# Patient Record
Sex: Female | Born: 1975 | Race: Black or African American | Hispanic: No | Marital: Married | Smoking: Former smoker
Health system: Southern US, Community
[De-identification: ages and names within clinical notes are randomized; demographics above are authoritative.]

## PROBLEM LIST (undated history)

## (undated) DIAGNOSIS — T8859XA Other complications of anesthesia, initial encounter: Secondary | ICD-10-CM

## (undated) DIAGNOSIS — K219 Gastro-esophageal reflux disease without esophagitis: Secondary | ICD-10-CM

## (undated) HISTORY — PX: TUBAL LIGATION: SHX77

## (undated) HISTORY — DX: Gastro-esophageal reflux disease without esophagitis: K21.9

---

## 1998-05-10 ENCOUNTER — Ambulatory Visit (HOSPITAL_COMMUNITY): Admission: RE | Admit: 1998-05-10 | Discharge: 1998-05-10 | Payer: Self-pay | Admitting: *Deleted

## 1998-10-26 ENCOUNTER — Encounter (HOSPITAL_COMMUNITY): Admission: AD | Admit: 1998-10-26 | Discharge: 1998-11-05 | Payer: Self-pay | Admitting: Obstetrics

## 1998-11-02 ENCOUNTER — Inpatient Hospital Stay (HOSPITAL_COMMUNITY): Admission: AD | Admit: 1998-11-02 | Discharge: 1998-11-05 | Payer: Self-pay | Admitting: *Deleted

## 1999-07-06 ENCOUNTER — Emergency Department (HOSPITAL_COMMUNITY): Admission: EM | Admit: 1999-07-06 | Discharge: 1999-07-06 | Payer: Self-pay | Admitting: Emergency Medicine

## 2000-06-06 ENCOUNTER — Emergency Department (HOSPITAL_COMMUNITY): Admission: EM | Admit: 2000-06-06 | Discharge: 2000-06-06 | Payer: Self-pay | Admitting: Emergency Medicine

## 2000-08-18 ENCOUNTER — Encounter: Admission: RE | Admit: 2000-08-18 | Discharge: 2000-08-18 | Payer: Self-pay | Admitting: Obstetrics & Gynecology

## 2000-09-04 ENCOUNTER — Encounter: Admission: RE | Admit: 2000-09-04 | Discharge: 2000-09-04 | Payer: Self-pay | Admitting: Obstetrics & Gynecology

## 2000-10-17 ENCOUNTER — Emergency Department (HOSPITAL_COMMUNITY): Admission: EM | Admit: 2000-10-17 | Discharge: 2000-10-17 | Payer: Self-pay | Admitting: Emergency Medicine

## 2001-02-09 ENCOUNTER — Encounter: Admission: RE | Admit: 2001-02-09 | Discharge: 2001-02-09 | Payer: Self-pay | Admitting: Obstetrics & Gynecology

## 2001-02-09 ENCOUNTER — Other Ambulatory Visit: Admission: RE | Admit: 2001-02-09 | Discharge: 2001-02-09 | Payer: Self-pay | Admitting: Obstetrics & Gynecology

## 2001-04-02 ENCOUNTER — Encounter: Admission: RE | Admit: 2001-04-02 | Discharge: 2001-04-02 | Payer: Self-pay | Admitting: Obstetrics & Gynecology

## 2001-10-08 ENCOUNTER — Encounter: Payer: Self-pay | Admitting: Emergency Medicine

## 2001-10-08 ENCOUNTER — Emergency Department (HOSPITAL_COMMUNITY): Admission: EM | Admit: 2001-10-08 | Discharge: 2001-10-08 | Payer: Self-pay | Admitting: Emergency Medicine

## 2002-05-04 ENCOUNTER — Emergency Department (HOSPITAL_COMMUNITY): Admission: EM | Admit: 2002-05-04 | Discharge: 2002-05-04 | Payer: Self-pay | Admitting: Emergency Medicine

## 2002-08-16 ENCOUNTER — Encounter: Admission: RE | Admit: 2002-08-16 | Discharge: 2002-08-16 | Payer: Self-pay | Admitting: *Deleted

## 2002-08-16 ENCOUNTER — Other Ambulatory Visit: Admission: RE | Admit: 2002-08-16 | Discharge: 2002-08-16 | Payer: Self-pay | Admitting: *Deleted

## 2002-11-14 ENCOUNTER — Encounter: Admission: RE | Admit: 2002-11-14 | Discharge: 2002-11-14 | Payer: Self-pay | Admitting: Internal Medicine

## 2003-07-10 ENCOUNTER — Encounter: Admission: RE | Admit: 2003-07-10 | Discharge: 2003-07-10 | Payer: Self-pay | Admitting: Internal Medicine

## 2004-05-22 ENCOUNTER — Emergency Department (HOSPITAL_COMMUNITY): Admission: EM | Admit: 2004-05-22 | Discharge: 2004-05-22 | Payer: Self-pay | Admitting: *Deleted

## 2004-05-23 ENCOUNTER — Emergency Department (HOSPITAL_COMMUNITY): Admission: EM | Admit: 2004-05-23 | Discharge: 2004-05-23 | Payer: Self-pay | Admitting: Emergency Medicine

## 2004-09-27 ENCOUNTER — Emergency Department (HOSPITAL_COMMUNITY): Admission: EM | Admit: 2004-09-27 | Discharge: 2004-09-27 | Payer: Self-pay | Admitting: Emergency Medicine

## 2004-12-21 ENCOUNTER — Emergency Department (HOSPITAL_COMMUNITY): Admission: EM | Admit: 2004-12-21 | Discharge: 2004-12-21 | Payer: Self-pay | Admitting: Emergency Medicine

## 2005-11-12 ENCOUNTER — Emergency Department (HOSPITAL_COMMUNITY): Admission: EM | Admit: 2005-11-12 | Discharge: 2005-11-12 | Payer: Self-pay | Admitting: Emergency Medicine

## 2006-07-08 ENCOUNTER — Ambulatory Visit: Payer: Self-pay | Admitting: Nurse Practitioner

## 2006-09-04 ENCOUNTER — Inpatient Hospital Stay (HOSPITAL_COMMUNITY): Admission: AD | Admit: 2006-09-04 | Discharge: 2006-09-04 | Payer: Self-pay | Admitting: Gynecology

## 2006-10-09 ENCOUNTER — Ambulatory Visit: Payer: Self-pay | Admitting: Nurse Practitioner

## 2006-12-11 ENCOUNTER — Emergency Department (HOSPITAL_COMMUNITY): Admission: EM | Admit: 2006-12-11 | Discharge: 2006-12-11 | Payer: Self-pay | Admitting: Emergency Medicine

## 2008-02-22 ENCOUNTER — Ambulatory Visit: Payer: Self-pay | Admitting: Obstetrics & Gynecology

## 2008-02-22 ENCOUNTER — Inpatient Hospital Stay (HOSPITAL_COMMUNITY): Admission: AD | Admit: 2008-02-22 | Discharge: 2008-02-23 | Payer: Self-pay | Admitting: Obstetrics & Gynecology

## 2008-08-23 IMAGING — CT CT PELVIS W/ CM
2 of 4 series · 12 of 32 positions shown, 17 images · IV contrast (omnipaque)
Comparison: none

CLINICAL DATA: Abdominal pain. 
 ABDOMEN CT WITH CONTRAST ? 02/23/08:
TECHNIQUE: Multidetector CT imaging of the abdomen was performed following the standard protocol during bolus administration of intravenous contrast. 
 Contrast: 100 ml Omnipaque 300 IV and oral contrast.
 Comparing pelvic ultrasound from 02/22/08.
TECHNIQUE: Multidetector CT imaging of the pelvis was performed following the standard protocol during bolus administration of intravenous contrast.

[Series 2: abd pelvis · axial · 0.70mm/px · z∈[-386,-146]mm · 4 of 81 slices shown, 9 images]
[im 17/81  soft-tissue]
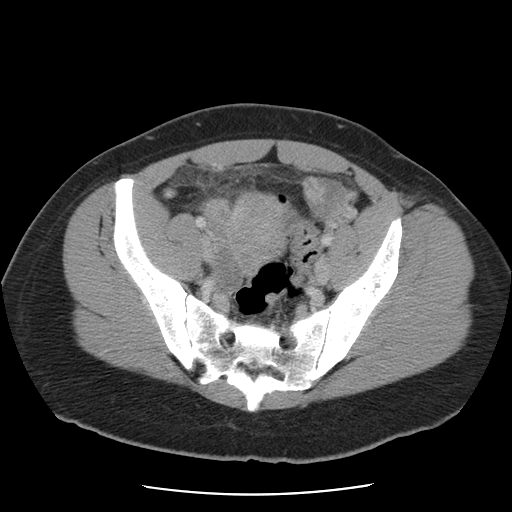
[im 17/81  lung]
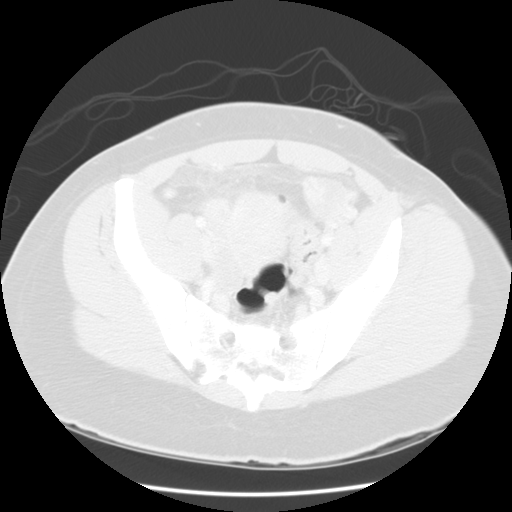
[im 17/81  bone]
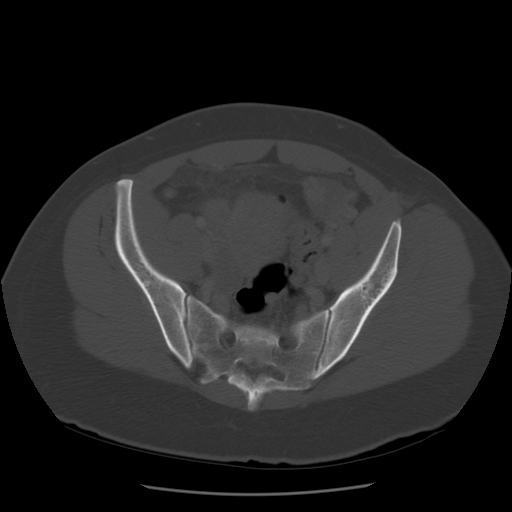
[im 33/81  soft-tissue]
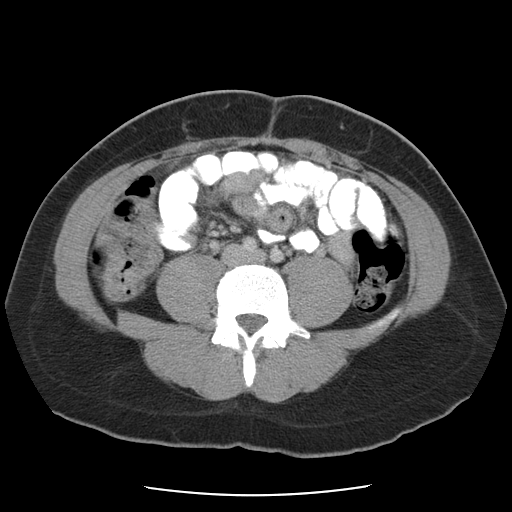
[im 33/81  lung]
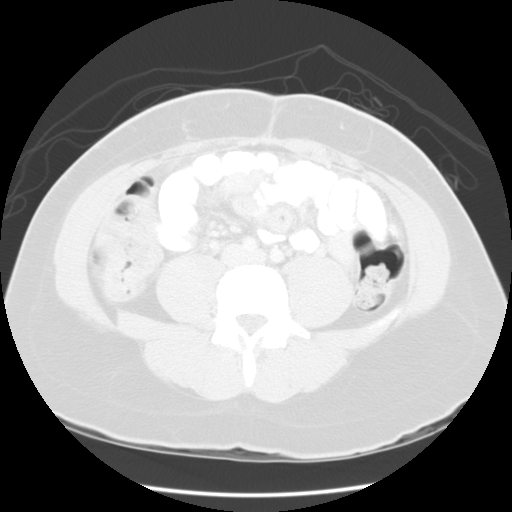
[im 49/81  soft-tissue]
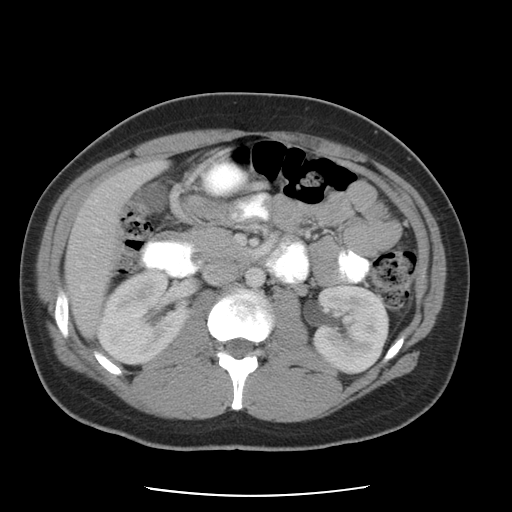
[im 49/81  lung]
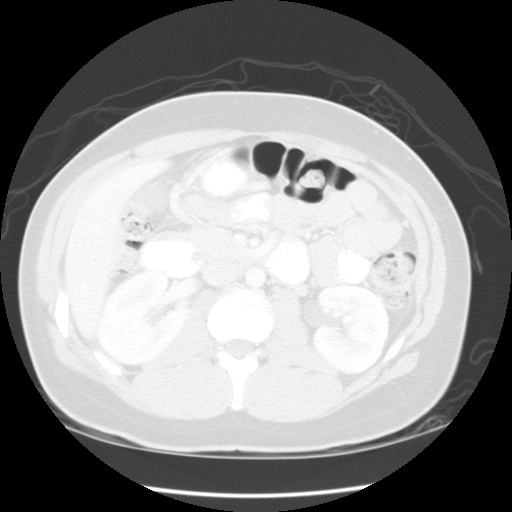
[im 65/81  soft-tissue]
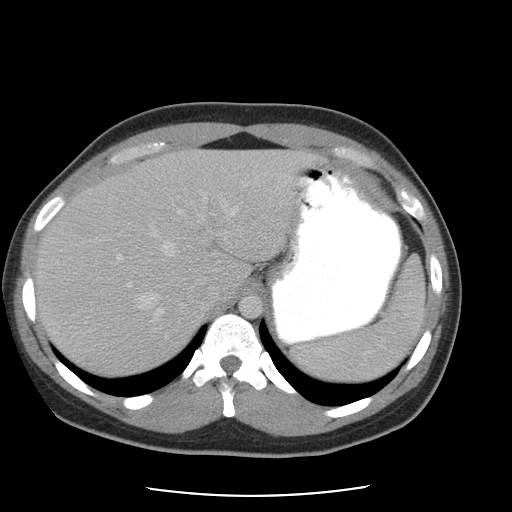
[im 65/81  lung]
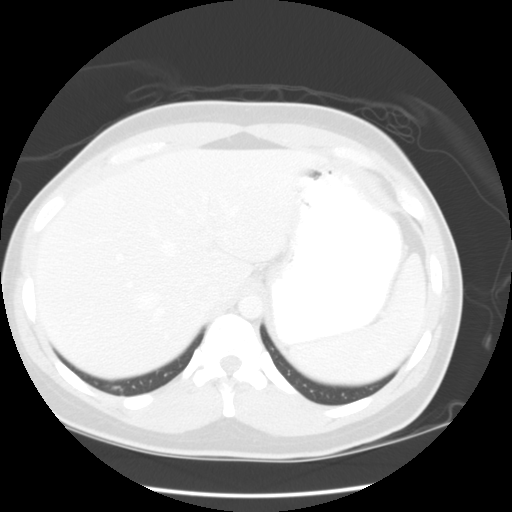

[Series 401: reformatted · sagittal · 0.88mm/px · 8 of 175 slices shown]
[im 15/175  soft-tissue]
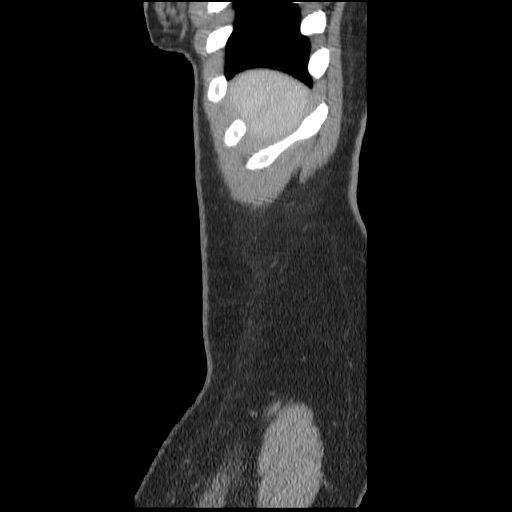
[im 44/175  soft-tissue]
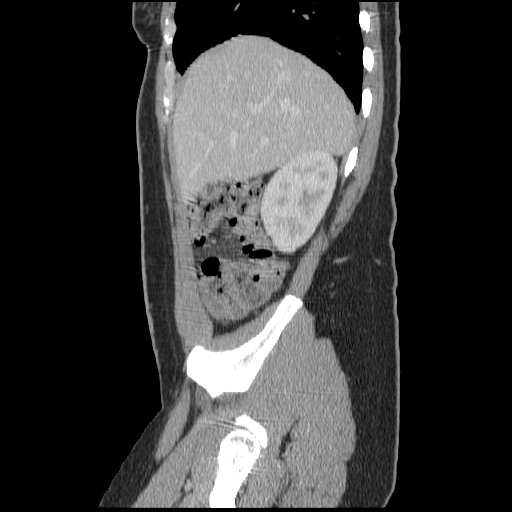
[im 59/175  soft-tissue]
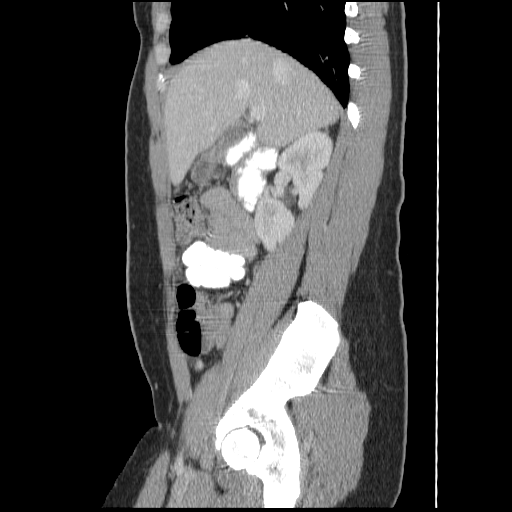
[im 73/175  soft-tissue]
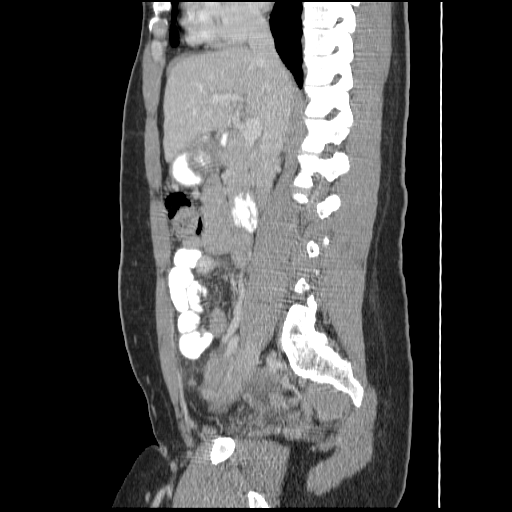
[im 102/175  soft-tissue]
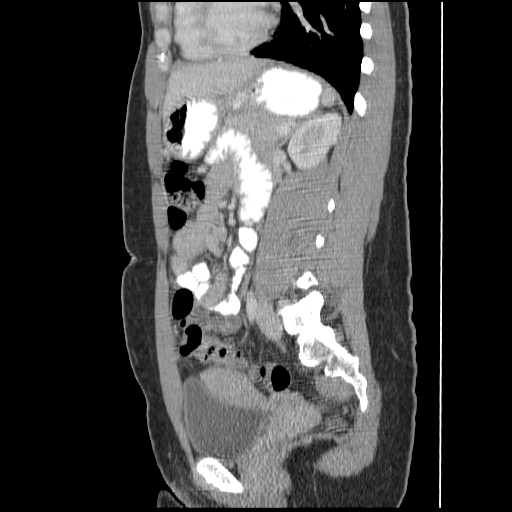
[im 117/175  soft-tissue]
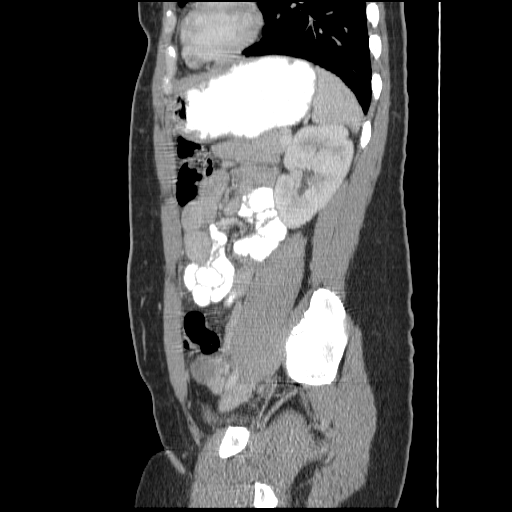
[im 131/175  soft-tissue]
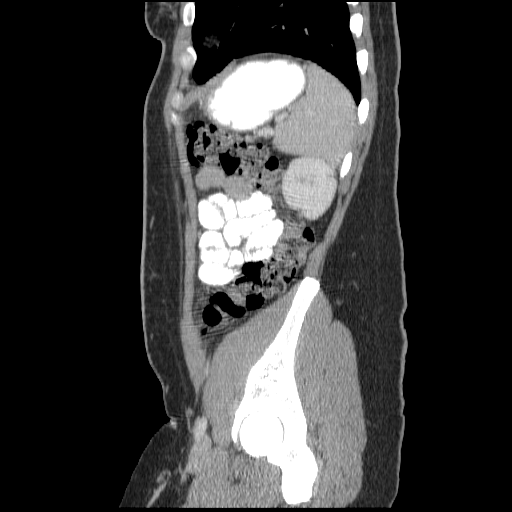
[im 160/175  soft-tissue]
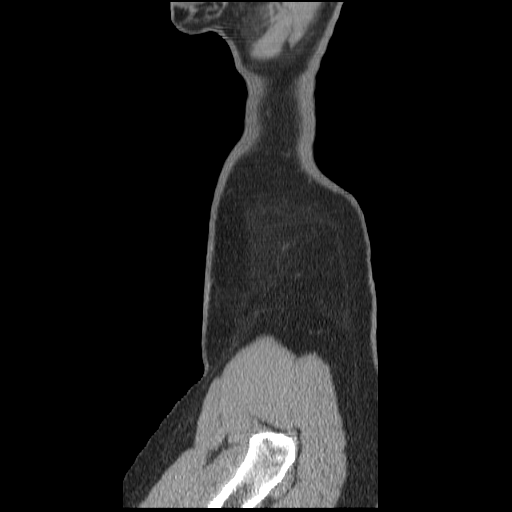

[12 of 32 positions shown; findings below may reference images not displayed]

FINDINGS: The liver, spleen, pancreas, adrenal glands, and gallbladder appear normal.  The kidneys appear normal.  
 There is mild stranding in the mesentery of the ascending colon, potentially reflecting mild localized inflammation.  The orally administered contrast medium is in the duodenum and jejunum but has not made its way through to the ileum.
IMPRESSION: Mild stranding in the ascending colon mesentery, possibly indicative of inflammation in this vicinity.  No overt colonic wall thickening is noted.
 PELVIS CT WITH CONTRAST ? 02/23/08:
FINDINGS: The left ovary measures 3.2 x 2.9 cm on image #65 of series 2, and has a dense component likely representing a follicle.  The appendix appears normal although there is some mesenteric stranding adjacent to the cecum.  A low-density structure along the right posterolateral margin of the uterus measuring 3.8 x 2.1 cm likely represents the right ovary, although there is also some fullness along the broad ligament.  There is potentially some wall thickening in the terminal ileum which may represent localized inflammation.
 The urinary bladder appears normal.
IMPRESSION: 1.  There is abnormal stranding in the mesentery in the right abdomen, possibly due to colitis/ileitis.  Some of this is adjacent to the appendix but the appendix is not inflamed.  
 2.  There is fullness along the broad ligament and right ovary.  Given the negative pregnancy test, ectopic pregnancy is not a consideration.  This may simply be incidental fullness.  Some of this may represent prominent parametrial vasculature.

## 2011-09-08 LAB — COMPREHENSIVE METABOLIC PANEL
ALT: 11
AST: 13
Albumin: 3.8
Alkaline Phosphatase: 56
BUN: 6
CO2: 27
Calcium: 9.2
Chloride: 103
Creatinine, Ser: 0.7
GFR calc Af Amer: >60
GFR calc non Af Amer: >60
Glucose, Bld: 94
Potassium: 4.1
Sodium: 139
Total Bilirubin: 0.8
Total Protein: 7.1

## 2011-09-08 LAB — URINALYSIS, ROUTINE W REFLEX MICROSCOPIC
Bilirubin Urine: NEGATIVE
Glucose, UA: NEGATIVE
Hgb urine dipstick: NEGATIVE
Ketones, ur: NEGATIVE
Nitrite: NEGATIVE
Protein, ur: NEGATIVE
Specific Gravity, Urine: 1.015
Urobilinogen, UA: 0.2
pH: 5.5

## 2011-09-08 LAB — CBC
HCT: 32 — ABNORMAL LOW
Hemoglobin: 11 — ABNORMAL LOW
MCHC: 34.3
MCV: 89.7
Platelets: 256
RBC: 3.57 — ABNORMAL LOW
RDW: 13.4
WBC: 13.1 — ABNORMAL HIGH

## 2011-09-08 LAB — WET PREP, GENITAL
Clue Cells Wet Prep HPF POC: NONE SEEN
Trich, Wet Prep: NONE SEEN
Yeast Wet Prep HPF POC: NONE SEEN

## 2011-09-08 LAB — DIFFERENTIAL
Basophils Absolute: 0
Basophils Relative: 0
Eosinophils Absolute: 0.3
Eosinophils Relative: 2
Lymphocytes Relative: 20
Lymphs Abs: 2.7
Monocytes Absolute: 0.6
Monocytes Relative: 4
Neutro Abs: 9.5 — ABNORMAL HIGH
Neutrophils Relative %: 73

## 2011-09-08 LAB — GC/CHLAMYDIA PROBE AMP, GENITAL
Chlamydia, DNA Probe: POSITIVE — AB
GC Probe Amp, Genital: NEGATIVE

## 2011-09-08 LAB — POCT PREGNANCY, URINE
Operator id: 114931
Preg Test, Ur: NEGATIVE

## 2013-04-25 ENCOUNTER — Other Ambulatory Visit (HOSPITAL_COMMUNITY)
Admission: RE | Admit: 2013-04-25 | Discharge: 2013-04-25 | Disposition: A | Discharge status: Home / Self Care | Payer: Medicaid Other | Source: Ambulatory Visit | Attending: Internal Medicine | Admitting: Internal Medicine

## 2013-04-25 ENCOUNTER — Encounter: Payer: Self-pay | Admitting: Internal Medicine

## 2013-04-25 ENCOUNTER — Ambulatory Visit (INDEPENDENT_AMBULATORY_CARE_PROVIDER_SITE_OTHER): Payer: Medicaid Other | Admitting: Internal Medicine

## 2013-04-25 VITALS — BP 116/67 | HR 61 | Temp 98.1°F | Ht 64.0 in | Wt 187.1 lb

## 2013-04-25 DIAGNOSIS — N760 Acute vaginitis: Secondary | ICD-10-CM

## 2013-04-25 DIAGNOSIS — N898 Other specified noninflammatory disorders of vagina: Secondary | ICD-10-CM

## 2013-04-25 DIAGNOSIS — Z113 Encounter for screening for infections with a predominantly sexual mode of transmission: Secondary | ICD-10-CM | POA: Insufficient documentation

## 2013-04-25 DIAGNOSIS — E669 Obesity, unspecified: Secondary | ICD-10-CM

## 2013-04-25 DIAGNOSIS — K219 Gastro-esophageal reflux disease without esophagitis: Secondary | ICD-10-CM

## 2013-04-25 DIAGNOSIS — Z01419 Encounter for gynecological examination (general) (routine) without abnormal findings: Secondary | ICD-10-CM | POA: Insufficient documentation

## 2013-04-25 DIAGNOSIS — B9689 Other specified bacterial agents as the cause of diseases classified elsewhere: Secondary | ICD-10-CM

## 2013-04-25 DIAGNOSIS — N949 Unspecified condition associated with female genital organs and menstrual cycle: Secondary | ICD-10-CM

## 2013-04-25 DIAGNOSIS — Z87898 Personal history of other specified conditions: Secondary | ICD-10-CM

## 2013-04-25 DIAGNOSIS — Z1151 Encounter for screening for human papillomavirus (HPV): Secondary | ICD-10-CM | POA: Insufficient documentation

## 2013-04-25 DIAGNOSIS — A499 Bacterial infection, unspecified: Secondary | ICD-10-CM

## 2013-04-25 DIAGNOSIS — B353 Tinea pedis: Secondary | ICD-10-CM

## 2013-04-25 MED ORDER — FLUCONAZOLE 150 MG PO TABS: 150.0000 mg | ORAL_TABLET | ORAL | Status: DC

## 2013-04-25 MED ORDER — OMEPRAZOLE 20 MG PO CPDR: 20.0000 mg | DELAYED_RELEASE_CAPSULE | Freq: Every day | ORAL | Status: DC

## 2013-04-25 NOTE — Progress Notes (Signed)
Subjective:    Patient ID: Nicole House, female    DOB: 1976-07-22, 37 y.o.   MRN: [id number]  HPI Comments: 37 y.o no significant PMH presents to establish care. Concerned that her feet are peeling itching, blistering.  She has tried Lotrimin, [employer] oint w/o relief.  She is concerned due to family history of diabetes that she may be a risk for diabetes.  She has sob at night associated with heartburn/reflux.  She takes she feels like food contents come back up and she has to get up to the bathroom and vomit and go to the shower and run water.  She is not aware of food triggers, Tums helps.  She c/o vaginal odor fishy.  She is sexually active with one partner with condom use.  She has a history of abnormal pap years ago she went to [facility] on [address] but repeat pap normal.  She has not had a pap in 5 years.  She has been without medical care for 5 years due to not having insurance.  She reports chronic h/a (frontal, crown).  Triggers include stress or washing her hair as she has dreadlocks and when they are wet they get heavy.  She drinks coffee and green tea but h/a were present prior to caffeine use.  She is supposed to wear glasses and odes not at times.  H/a are worse with light, noise.  She does not have a h/a today and normally takes Xtra strength Tylenol.    SH: 2 kids 14 and 17.  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 Denies etOH, cigarettes, other drugs.      Review of Systems  Gastrointestinal: Negative for abdominal pain and constipation.  Genitourinary: Negative for dysuria and vaginal discharge.  Neurological: Negative for headaches.       Objective:   Physical Exam  Nursing note and vitals reviewed. Constitutional: She is oriented to person, place, and time. Vital signs are normal. She appears well-developed and well-nourished. She is cooperative. No distress.  HENT:  Head: Normocephalic and atraumatic.  Mouth/Throat: Oropharynx is clear and moist and  mucous membranes are normal. Abnormal dentition. No oropharyngeal exudate.  Broken tooth back molar  Eyes: Conjunctivae are normal. Pupils are equal, round, and reactive to light. Right eye exhibits no discharge. Left eye exhibits no discharge. No scleral icterus.  Cardiovascular: Normal rate, regular rhythm, S1 normal, S2 normal and normal heart sounds.   No murmur heard. Pulmonary/Chest: Effort normal and breath sounds normal. No respiratory distress. She has no wheezes.  Abdominal: Soft. Bowel sounds are normal. There is no tenderness.  Genitourinary: Pelvic exam was performed with patient supine. Cervix exhibits discharge and friability. Cervix exhibits no motion tenderness. Right adnexum displays no mass, no tenderness and no fullness. Left adnexum displays no mass, no tenderness and no fullness. Vaginal discharge found.  Cervix was friable with pap smear swab.  No CMT.  Clear to white min. Discharge and fishy odor   Neurological: She is alert and oriented to person, place, and time. She has normal strength. Gait normal.  Neurologically intact  Skin: Skin is warm and dry. No rash noted. She is not diaphoretic.  Feel with peeling to soles. Toenails painted   Psychiatric: She has a normal mood and affect. Her speech is normal and behavior is normal. Judgment and thought content normal. Cognition and memory are normal.          Assessment & Plan:  F/u 4-6 weeks t. Pedis  and GERD

## 2013-04-25 NOTE — Assessment & Plan Note (Signed)
Repeat pap today

## 2013-04-25 NOTE — Assessment & Plan Note (Signed)
Wet prep, GC/C done today

## 2013-04-25 NOTE — Assessment & Plan Note (Signed)
Will treat with PPI low dose x 4-6 wks and see patient for f/u Prilosec 20 mg qd

## 2013-04-25 NOTE — Addendum Note (Signed)
Addended by: Bufford Spikes on: 04/25/2013 03:28 PM   Modules accepted: Orders

## 2013-04-25 NOTE — Patient Instructions (Addendum)
Please read the information below Start taking Prilosec 20 mg x 4-6 weeks  Start taking Diflucan 150mg  every week x 4 weeks  Return ni 4-6 weeks   Fluconazole tablets What is this medicine? FLUCONAZOLE (floo KON na zole) is an antifungal medicine. It is used to treat certain kinds of fungal or yeast infections. This medicine may be used for other purposes; ask your health care provider or pharmacist if you have questions. What should I tell my health care provider before I take this medicine? They need to know if you have any of these conditions: -electrolyte abnormalities -history of irregular heart beat -kidney disease -an unusual or allergic reaction to fluconazole, other azole antifungals, medicines, foods, dyes, or preservatives -pregnant or trying to get pregnant -breast-feeding How should I use this medicine? Take this medicine by mouth. Follow the directions on the prescription label. Do not take your medicine more often than directed. Talk to your pediatrician regarding the use of this medicine in children. Special care may be needed. This medicine has been used in children as young as 38 months of age. Overdosage: If you think you have taken too much of this medicine contact a poison control center or emergency room at once. NOTE: This medicine is only for you. Do not share this medicine with others. What if I miss a dose? If you miss a dose, take it as soon as you can. If it is almost time for your next dose, take only that dose. Do not take double or extra doses. What may interact with this medicine? Do not take this medicine with any of the following medications: -cisapride -pimozide -red yeast rice This medicine may also interact with the following medications: -birth control pills -cyclosporine -diuretics like hydrochlorothiazide -medicines for diabetes that are taken by mouth -medicines for high cholesterol like atorvastatin, lovastatin or  simvastatin -phenytoin -ramelteon -rifabutin -rifampin -some medicines for anxiety or sleep -tacrolimus -terfenadine -theophylline -warfarin This list may not describe all possible interactions. Give your health care provider a list of all the medicines, herbs, non-prescription drugs, or dietary supplements you use. Also tell them if you smoke, drink alcohol, or use illegal drugs. Some items may interact with your medicine. What should I watch for while using this medicine? Visit your doctor or health care professional for regular checkups. If you are taking this medicine for a long time you may need blood work. Tell your doctor if your symptoms do not improve. Some fungal infections need many weeks or months of treatment to cure. Alcohol can increase possible damage to your liver. Avoid alcoholic drinks. If you have a vaginal infection, do not have sex until you have finished your treatment. You can wear a sanitary napkin. Do not use tampons. Wear freshly washed cotton, not synthetic, panties. What side effects may I notice from receiving this medicine? Side effects that you should report to your doctor or health care professional as soon as possible: -allergic reactions like skin rash or itching, hives, swelling of the lips, mouth, tongue, or throat -dark urine -feeling dizzy or faint -irregular heartbeat or chest pain -redness, blistering, peeling or loosening of the skin, including inside the mouth -trouble breathing -unusual bruising or bleeding -vomiting -yellowing of the eyes or skin Side effects that usually do not require medical attention (report to your doctor or health care professional if they continue or are bothersome): -changes in how food tastes -diarrhea -headache -stomach upset or nausea This list may not describe all possible side effects.  Call your doctor for medical advice about side effects. You may report side effects to FDA at 1-800-FDA-1088. Where should I  keep my medicine? Keep out of the reach of children. Store at room temperature below 30 degrees C (86 degrees F). Throw away any medicine after the expiration date. NOTE: This sheet is a summary. It may not cover all possible information. If you have questions about this medicine, talk to your doctor, pharmacist, or health care provider.  2012, Elsevier/Gold Standard. (04/10/2008 4:59:25 PM) Omeprazole tablets (OTC) What is this medicine? OMEPRAZOLE (oh ME pray zol) prevents the production of acid in the stomach. It is used to treat the symptoms of heartburn. You can buy this medicine without a prescription. This product is not for long-term use, unless otherwise directed by your doctor or health care professional. This medicine may be used for other purposes; ask your health care provider or pharmacist if you have questions. What should I tell my health care provider before I take this medicine? They need to know if you have any of these conditions: -black or bloody stools -chest pain -difficulty swallowing -have had heartburn for over 3 months -have heartburn with dizziness, lightheadedness or sweating -liver disease -stomach pain -unexplained weight loss -vomiting with blood -wheezing -an unusual or allergic reaction to omeprazole, other medicines, foods, dyes, or preservatives -pregnant or trying to get pregnant -breast-feeding How should I use this medicine? Take this medicine by mouth. Follow the directions on the product label. If you are taking this medicine without a prescription, take one tablet every day. Do not use for longer than 14 days or repeat a course of treatment more often than every 4 months unless directed by a doctor or healthcare professional. Take your dose at regular intervals every 24 hours. Swallow the tablet whole with a drink of water. Do not crush, break or chew. This medicine works best if taken on an empty stomach 30 minutes before breakfast. If you are using  this medicine with the prescription of your doctor or healthcare professional, follow the directions you were given. Do not take your medicine more often than directed. Talk to your pediatrician regarding the use of this medicine in children. Special care may be needed. Overdosage: If you think you have taken too much of this medicine contact a poison control center or emergency room at once. NOTE: This medicine is only for you. Do not share this medicine with others. What if I miss a dose? If you miss a dose, take it as soon as you can. If it is almost time for your next dose, take only that dose. Do not take double or extra doses. What may interact with this medicine? Do not take this medicine with any of the following medications: -atazanavir -clopidogrel -nelfinavir This medicine may also interact with the following medications: -ampicillin -certain medicines for anxiety or sleep -certain medicines that treat or prevent blood clots like warfarin -cyclosporine -diazepam -digoxin -disulfiram -iron salts -phenytoin -prescription medicine for fungal or yeast infection like itraconazole, ketoconazole, voriconazole -saquinavir -tacrolimus This list may not describe all possible interactions. Give your health care provider a list of all the medicines, herbs, non-prescription drugs, or dietary supplements you use. Also tell them if you smoke, drink alcohol, or use illegal drugs. Some items may interact with your medicine. What should I watch for while using this medicine? It can take several days before your heartburn gets better. Check with your doctor or health care professional if your condition does  not start to get better, or if it gets worse. Do not treat diarrhea with over the counter products. Contact your doctor if you have diarrhea that lasts more than 2 days or if it is severe and watery. Do not treat yourself for heartburn with this medicine for more than 14 days in a row. You  should only use this medicine for a 2-week treatment period once every 4 months. If your symptoms return shortly after your therapy is complete, or within the 4 month time frame, call your doctor or health care professional. What side effects may I notice from receiving this medicine? Side effects that you should report to your doctor or health care professional as soon as possible: -allergic reactions like skin rash, itching or hives, swelling of the face, lips, or tongue -bone, muscle or joint pain -breathing problems -chest pain or chest tightness -dark yellow or brown urine -diarrhea -dizziness -fast, irregular heartbeat -feeling faint or lightheaded -fever or sore throat -muscle spasm -palpitations -redness, blistering, peeling or loosening of the skin, including inside the mouth -seizures -tremors -unusual bleeding or bruising -unusually weak or tired -yellowing of the eyes or skin Side effects that usually do not require medical attention (Report these to your doctor or health care professional if they continue or are bothersome.): -constipation -dry mouth -headache -loose stools -nausea This list may not describe all possible side effects. Call your doctor for medical advice about side effects. You may report side effects to FDA at 1-800-FDA-1088. Where should I keep my medicine? Keep out of the reach of children. Store at room temperature between 20 and 25 degrees C (68 and 77 degrees F). Protect from light and moisture. Throw away any unused medicine after the expiration date. NOTE: This sheet is a summary. It may not cover all possible information. If you have questions about this medicine, talk to your doctor, pharmacist, or health care provider.  2013, Elsevier/Gold Standard. (09/01/2011 11:40:25 AM)  Heartburn Heartburn is a painful, burning sensation in the chest. It may feel worse in certain positions, such as lying down or bending over. It is caused by stomach acid  backing up into the tube that carries food from the mouth down to the stomach (lower esophagus).  CAUSES   Large meals.  Certain foods and drinks.  Exercise.  Increased acid production.  Being overweight or obese.  Certain medicines. SYMPTOMS   Burning pain in the chest or lower throat.  Bitter taste in the mouth.  Coughing. DIAGNOSIS  If the usual treatments for heartburn do not improve your symptoms, then tests may be done to see if there is another condition present. Possible tests may include:  X-rays.  Endoscopy. This is when a tube with a light and a camera on the end is used to examine the esophagus and the stomach.  A test to measure the amount of acid in the esophagus (pH test).  A test to see if the esophagus is working properly (esophageal manometry).  Blood, breath, or stool tests to check for bacteria that cause ulcers. TREATMENT   Your caregiver may tell you to use certain over-the-counter medicines (antacids, acid reducers) for mild heartburn.  Your caregiver may prescribe medicines to decrease the acid in your stomach or protect your stomach lining.  Your caregiver may recommend certain diet changes.  For severe cases, your caregiver may recommend that the head of your bed be elevated on blocks. (Sleeping with more pillows is not an effective treatment as it only  changes the position of your head and does not improve the main problem of stomach acid refluxing into the esophagus.) HOME CARE INSTRUCTIONS   Take all medicines as directed by your caregiver.  Raise the head of your bed by putting blocks under the legs if instructed to by your caregiver.  Do not exercise right after eating.  Avoid eating 2 or 3 hours before bed. Do not lie down right after eating.  Eat small meals throughout the day instead of 3 large meals.  Stop smoking if you smoke.  Maintain a healthy weight.  Identify foods and beverages that make your symptoms worse and avoid  them. Foods you may want to avoid include:  Peppers.  Chocolate.  High-fat foods, including fried foods.  Spicy foods.  Garlic and onions.  Citrus fruits, including oranges, grapefruit, lemons, and limes.  Food containing tomatoes or tomato products.  Mint.  Carbonated drinks, caffeinated drinks, and alcohol.  Vinegar. SEEK IMMEDIATE MEDICAL CARE IF:  You have severe chest pain that goes down your arm or into your jaw or neck.  You feel sweaty, dizzy, or lightheaded.  You are short of breath.  You vomit blood.  You have difficulty or pain with swallowing.  You have bloody or black, tarry stools.  You have episodes of heartburn more than 3 times a week for more than 2 weeks. MAKE SURE YOU:  Understand these instructions.  Will watch your condition.  Will get help right away if you are not doing well or get worse. Document Released: 04/19/2009 Document Revised: 02/23/2012 Document Reviewed: 05/18/2011 [facility] Patient Information 2013 [place], [place].  Athlete's Foot Athlete's foot (tinea pedis) is a fungal infection of the skin on the feet. It often occurs on the skin between the toes or underneath the toes. It can also occur on the soles of the feet. Athlete's foot is more likely to occur in hot, humid weather. Not washing your feet or changing your socks often enough can contribute to athlete's foot. The infection can spread from person to person (contagious). CAUSES Athlete's foot is caused by a fungus. This fungus thrives in warm, moist places. Most people get athlete's foot by sharing shower stalls, towels, and wet floors with an infected person. People with weakened immune systems, including those with diabetes, may be more likely to get athlete's foot. SYMPTOMS   Itchy areas between the toes or on the soles of the feet.  White, flaky, or scaly areas between the toes or on the soles of the feet.  Tiny, intensely itchy blisters between the toes or on the  soles of the feet.  Tiny cuts on the skin. These cuts can develop a bacterial infection.  Thick or discolored toenails. DIAGNOSIS  Your caregiver can usually tell what the problem is by doing a physical exam. Your caregiver may also take a skin sample from the rash area. The skin sample may be examined under a microscope, or it may be tested to see if fungus will grow in the sample. A sample may also be taken from your toenail for testing. TREATMENT  Over-the-counter and prescription medicines can be used to kill the fungus. These medicines are available as powders or creams. Your caregiver can suggest medicines for you. Fungal infections respond slowly to treatment. You may need to continue using your medicine for several weeks. PREVENTION   Do not share towels.  Wear sandals in wet areas, such as shared locker rooms and shared showers.  Keep your feet dry. Wear  shoes that allow air to circulate. Wear cotton or wool socks. HOME CARE INSTRUCTIONS   Take medicines as directed by your caregiver. Do not use steroid creams on athlete's foot.  Keep your feet clean and cool. Wash your feet daily and dry them thoroughly, especially between your toes.  Change your socks every day. Wear cotton or wool socks. In hot climates, you may need to change your socks 2 to 3 times per day.  Wear sandals or canvas tennis shoes with good air circulation.  If you have blisters, soak your feet in Burow's solution or Epsom salts for 20 to 30 minutes, 2 times a day to dry out the blisters. Make sure you dry your feet thoroughly afterward. SEEK MEDICAL CARE IF:   You have a fever.  You have swelling, soreness, warmth, or redness in your foot.  You are not getting better after 7 days of treatment.  You are not completely cured after 30 days.  You have any problems caused by your medicines. MAKE SURE YOU:   Understand these instructions.  Will watch your condition.  Will get help right away if you  are not doing well or get worse. Document Released: 11/28/2000 Document Revised: 02/23/2012 Document Reviewed: 09/19/2011 [facility] Patient Information 2013 [place], [place].  General Instructions:  Please read the information below Start taking Prilosec 20 mg x 4-6 weeks  Start taking Diflucan 150mg  every week x 4 weeks  Return ni 4-6 weeks    Treatment Goals:  Goals (1 Years of Data) as of 04/25/13   None      Progress Toward Treatment Goals:    Self Care Goals & Plans:       Care Management & Community Referrals:  Referral 04/25/2013  Referrals made to community resources none

## 2013-04-25 NOTE — Progress Notes (Signed)
INTERNAL MEDICINE TEACHING ATTENDING ADDENDUM: I discussed this case with Dr. Shirlee Latch soon after the patient visit. I have read the documentation and I agree with the plan of care. Please see the resident note for details of management.

## 2013-04-25 NOTE — Assessment & Plan Note (Signed)
Will treat with Fluconazole 150 mg qweek and 2-6 weeks  RTC in 4-6 weeks

## 2013-04-26 ENCOUNTER — Encounter: Payer: Self-pay | Admitting: Internal Medicine

## 2013-04-26 DIAGNOSIS — N760 Acute vaginitis: Secondary | ICD-10-CM | POA: Insufficient documentation

## 2013-04-26 LAB — HEMOGLOBIN A1C
Hgb A1c MFr Bld: 5.4 % (ref <5.7)
Mean Plasma Glucose: 108 mg/dL (ref <117)

## 2013-04-26 LAB — URINALYSIS, ROUTINE W REFLEX MICROSCOPIC
Bilirubin Urine: NEGATIVE
Glucose, UA: NEGATIVE mg/dL
Hgb urine dipstick: NEGATIVE
Leukocytes, UA: NEGATIVE
Protein, ur: NEGATIVE mg/dL
Specific Gravity, Urine: 1.028 (ref 1.005–1.030)
Urobilinogen, UA: 0.2 mg/dL (ref 0.0–1.0)
pH: 7 (ref 5.0–8.0)

## 2013-04-26 LAB — BASIC METABOLIC PANEL
BUN: 11 mg/dL (ref 6–23)
CO2: 25 mEq/L (ref 19–32)
Chloride: 106 mEq/L (ref 96–112)
Creat: 0.83 mg/dL (ref 0.50–1.10)
Glucose, Bld: 76 mg/dL (ref 70–99)
Potassium: 4.6 mEq/L (ref 3.5–5.3)
Sodium: 139 mEq/L (ref 135–145)

## 2013-04-26 MED ORDER — METRONIDAZOLE 500 MG PO TABS: 500.0000 mg | ORAL_TABLET | Freq: Two times a day (BID) | ORAL | Status: DC

## 2013-04-26 NOTE — Addendum Note (Signed)
Addended by: Annett Gula on: 04/26/2013 04:01 PM   Modules accepted: Orders

## 2013-04-26 NOTE — Assessment & Plan Note (Signed)
Rx Flagyl 500 mg bid x 7 days

## 2013-04-29 ENCOUNTER — Encounter: Payer: Self-pay | Admitting: Internal Medicine

## 2013-08-04 ENCOUNTER — Telehealth: Payer: Self-pay | Admitting: *Deleted

## 2013-08-04 ENCOUNTER — Ambulatory Visit (INDEPENDENT_AMBULATORY_CARE_PROVIDER_SITE_OTHER): Payer: Medicaid Other | Admitting: Internal Medicine

## 2013-08-04 ENCOUNTER — Other Ambulatory Visit (HOSPITAL_COMMUNITY)
Admission: RE | Admit: 2013-08-04 | Discharge: 2013-08-04 | Disposition: A | Discharge status: Home / Self Care | Payer: Medicaid Other | Source: Ambulatory Visit | Attending: Internal Medicine | Admitting: Internal Medicine

## 2013-08-04 VITALS — BP 112/69 | HR 75 | Temp 98.8°F | Ht 64.0 in | Wt 185.7 lb

## 2013-08-04 DIAGNOSIS — A599 Trichomoniasis, unspecified: Secondary | ICD-10-CM

## 2013-08-04 DIAGNOSIS — N760 Acute vaginitis: Secondary | ICD-10-CM | POA: Insufficient documentation

## 2013-08-04 DIAGNOSIS — N390 Urinary tract infection, site not specified: Secondary | ICD-10-CM

## 2013-08-04 DIAGNOSIS — Z113 Encounter for screening for infections with a predominantly sexual mode of transmission: Secondary | ICD-10-CM | POA: Insufficient documentation

## 2013-08-04 DIAGNOSIS — R300 Dysuria: Secondary | ICD-10-CM

## 2013-08-04 LAB — POCT URINE PREGNANCY: Preg Test, Ur: NEGATIVE

## 2013-08-04 LAB — POCT URINALYSIS DIPSTICK
Bilirubin, UA: NEGATIVE
Glucose, UA: NEGATIVE
Ketones, UA: NEGATIVE
Nitrite, UA: NEGATIVE
Protein, UA: 30
Spec Grav, UA: 1.02
Urobilinogen, UA: 0.2
pH, UA: 7.5

## 2013-08-04 MED ORDER — NITROFURANTOIN MONOHYD MACRO 100 MG PO CAPS: 100.0000 mg | ORAL_CAPSULE | Freq: Two times a day (BID) | ORAL | Status: AC

## 2013-08-04 NOTE — Telephone Encounter (Signed)
We will see her

## 2013-08-04 NOTE — Progress Notes (Signed)
  Subjective:    Patient ID: Nicole House, female    DOB: [birth year], 37 y.o.   MRN: [id number]  CC: Dysuria X 24 hours.   HPI  Nicole House is a 37yo woman with PMH of BV who presents for 24 hours of vaginal burning and aching.  She notes that these symptoms are different from her symptoms of previous BV.  She reports dysuria, burning pain 10/10, difficulty initiating her stream and occasional dribbling of urine on her undwear.  She took one dose of amoxicillin that she had available as well as ibuprofen which helped the pain somewhat.  She denies any change in vaginal discharge or itching, swelling or rash.  She is currently sexually active with one partner (husband).  She had a tubal ligation at 37 yo.  Further symptoms include mild nausea which started on the same day as her symptoms and dry heaving.      Review of Systems  Constitutional: Positive for appetite change. Negative for fever and fatigue.  HENT: Negative for hearing loss and ear pain.   Eyes: Negative for pain and redness.  Respiratory: Negative for cough and shortness of breath.   Cardiovascular: Negative for chest pain and leg swelling.  Gastrointestinal: Positive for abdominal pain (mild suprapubic. ).  Endocrine: Positive for polyuria.  Genitourinary: Positive for dysuria, urgency, frequency, decreased urine volume, vaginal discharge (same as normal premenstrual) and difficulty urinating. Negative for hematuria and flank pain.  Musculoskeletal: Negative for back pain and gait problem.  Skin: Negative for rash and wound.  Neurological: Positive for headaches. Negative for dizziness and weakness.       Objective:   Physical Exam  Constitutional: She is oriented to person, place, and time. She appears well-developed and well-nourished. No distress.  HENT:  Head: Normocephalic and atraumatic.  Eyes: Conjunctivae are normal. No scleral icterus.  Cardiovascular: Normal rate, regular rhythm and normal heart sounds.    Pulmonary/Chest: Effort normal and breath sounds normal. No respiratory distress. She has no wheezes.  Abdominal: Soft. Bowel sounds are normal. There is no tenderness.  Genitourinary: There is no rash or tenderness on the right labia. There is no rash or tenderness on the left labia. No erythema, tenderness or bleeding around the vagina. Vaginal discharge (mild whitish/yellow discharge at cervical os, collected for culture) found.  Musculoskeletal: She exhibits no edema and no tenderness.  Neurological: She is alert and oriented to person, place, and time.  Skin: Skin is warm and dry.  Psychiatric: She has a normal mood and affect. Her behavior is normal.    Labs: Wet prep, pregnancy test, UA, UC.       Assessment & Plan:  RTC as needed.

## 2013-08-04 NOTE — Progress Notes (Signed)
Subjective:    Patient ID: Nicole House, female   DOB: 04-08-1976, 37 y.o.   MRN: [id number]  This is a medical student note. Please see Dr. Donnetta Hutching note for further information on this appointment.   Ms. Nicole House is a 37 yo Female w/ past medical history significant for bacterial vaginosis who presents for evaluation of burning sensation with urination.   HPI: [name] noticed painful urge to urinate starting 8/20 without the ability to void her bladder completely. The pain was 10/10. She reports that her stream dribbles when she is able to urinate. She has frequent urge to urinate and reports occasionally going on her underwear, but when she reaches the bathroom she reports being unable to void. She reports taking one dose of amoxicillin (500mg ) at 6:00pm and 800mg  of ibuprofen at 4 pm that reduced her pain to 5/10. She describes the pain as an ache/throbbing/burning sensation and only present when she needs to use the restroom. She reports fullness associated with the urge to urinate, but only experiences pain with urination. She denies itching, swelling, rashes or other symptoms of her external genitalia. Other than the ibuprofen nothing has improved her symptoms and nothing has worsened her symptoms aside from the sense of needing to void. She describes back pain or pain elsewhere in her body. She reports a small amount of clear/white non-odorous discharge present when wiping that she believes to be a precursor to her menses. She is sexually active and her last intercourse was three days ago with her husband of 6 years without using protection s/p tubal ligation at age 73. She reports mild nausea and dry heaving starting yesterday morning before her symptoms developed, but did not have emesis. She reports that the sensation currently is vastly different than her experience with bacterial vaginosis in May of this year. She reports having a UTI 10 years ago that respond well to antibiotic therapy.     Review of Systems  Constitutional: Positive for appetite change. Negative for fever, chills, diaphoresis, activity change, fatigue and unexpected weight change.       Patient feels that she does not drink adequate water throughout the day, but more teas and sodas recently.   HENT: Positive for neck stiffness. Negative for hearing loss, ear pain, nosebleeds, congestion, sore throat, facial swelling, rhinorrhea, sneezing, drooling, mouth sores, trouble swallowing, neck pain, dental problem, voice change, postnasal drip, sinus pressure, tinnitus and ear discharge.   Eyes: Negative.   Respiratory: Positive for cough.   Cardiovascular: Negative.   Gastrointestinal: Positive for nausea and vomiting. Negative for abdominal pain, diarrhea, constipation, blood in stool, abdominal distention, anal bleeding and rectal pain.  Endocrine: Positive for polyuria. Negative for cold intolerance, heat intolerance, polydipsia and polyphagia.  Genitourinary: Positive for dysuria, urgency, frequency, vaginal discharge and difficulty urinating. Negative for hematuria, flank pain, decreased urine volume, vaginal bleeding, enuresis, genital sores, vaginal pain, menstrual problem, pelvic pain and dyspareunia.  Musculoskeletal: Negative for myalgias, back pain, joint swelling, arthralgias and gait problem.  Skin: Negative.   Allergic/Immunologic: Negative.   Neurological: Positive for headaches. Negative for dizziness, tremors, seizures, syncope, facial asymmetry, speech difficulty, weakness, light-headedness and numbness.  Hematological: Negative for adenopathy. Bruises/bleeds easily.  Psychiatric/Behavioral: Negative.        Objective:   Physical Exam  Constitutional: She is oriented to person, place, and time. She appears well-developed.  HENT:  Head: Normocephalic and atraumatic.  Eyes: EOM are normal. Pupils are equal, round, and reactive to light. Right eye exhibits  no discharge. Left eye exhibits no  discharge. No scleral icterus.  Neck: Normal range of motion. Neck supple.  Cardiovascular: Normal rate and regular rhythm.  Exam reveals no gallop and no friction rub.   No murmur heard. Pulmonary/Chest: Effort normal and breath sounds normal. No respiratory distress. She has no wheezes. She has no rales. She exhibits no tenderness.  Abdominal: Soft. Bowel sounds are normal. She exhibits no distension and no mass. There is tenderness. There is no rebound and no guarding.  Musculoskeletal: Normal range of motion.  Neurological: She is alert and oriented to person, place, and time.  Skin: Skin is warm.       Assessment:     Patient signs and symptoms are consistent with uncomplicated UTI. Considering her acute onset of symptoms suprapubic tenderness to palpation and during urination it is the most likely diagnosis. However, Gonorrhea, Chlamydia, bacterial vaginosis and candida are also possibility but unlikely due to consistent sexual partner without changes recently, lack of productive odor and a lack of fungal infection int he past. Dr. Criselda Peaches completed a vaginal exam and her findings are listed in her note. A pap smear was not indicated as she has had a recent pap smear without significant findings.     Plan:     Plan to prescribe Macrobid for 7 days for prophylactic coverage of UTI. Plan to alter antibiotic pending results of wet mount, chlamydia, gonorrhea and candida findings. Patient will be contacted in the event that our test will alter her treatment or there are other concerning findings.

## 2013-08-04 NOTE — Telephone Encounter (Signed)
Pt calls c/o urgency to void and then being unable to do so, this has happened multiple times in last few days also has lower abd pain. Placed in med student's 1000 slot

## 2013-08-04 NOTE — Patient Instructions (Addendum)
It was a pleasure to meet you today! - We will prescribe you an antibiotic called Macrobid that will last 7 days. You should take it every 12 hours.  - We obtained samples today that will allow [place] to check for other possibilities causing your symptoms. If the test show anything concerning we will contact you. - If your symptoms do not improve in 7 days, contact our office if you have not heard from [place] before then. - If your develop fever, chills, nausea or vomiting or changes in pain or area of pain or inability to pass urine please contact our office.

## 2013-08-06 LAB — URINE CULTURE: Colony Count: >=100000

## 2013-08-08 ENCOUNTER — Encounter: Payer: Self-pay | Admitting: Internal Medicine

## 2013-08-08 DIAGNOSIS — A599 Trichomoniasis, unspecified: Secondary | ICD-10-CM | POA: Insufficient documentation

## 2013-08-08 DIAGNOSIS — N390 Urinary tract infection, site not specified: Secondary | ICD-10-CM | POA: Insufficient documentation

## 2013-08-08 MED ORDER — METRONIDAZOLE 500 MG PO TABS: 2000.0000 mg | ORAL_TABLET | Freq: Once | ORAL | Status: AC

## 2013-08-08 NOTE — Assessment & Plan Note (Signed)
UA and UC sent - resulted as E.coli UTI.  She was sent home on 7 day course of macrobid, which the bacteria is sensitive to.

## 2013-08-08 NOTE — Assessment & Plan Note (Signed)
Wet prep came back + for trichomonas.  Will send 2 gm dose of Flagyl for her to take.  Will advise her partner to be treated as well.  Would avoid sexual intercourse for 7 days post treatment.

## 2013-08-08 NOTE — Addendum Note (Signed)
Addended by: Debe Coder B on: 08/08/2013 11:57 AM   Modules accepted: Orders

## 2013-08-17 NOTE — Progress Notes (Signed)
Pt called clinic 08/17/13 11:15AM and read letter from 08/08/13 from Dr Criselda Peaches to pt and then mailed to pt. Metronidazole was called in to [facility] per pt request. Stanton Kidney Jenisha Faison RN 08/17/13 11:30AM

## 2014-07-13 ENCOUNTER — Encounter: Payer: Self-pay | Admitting: Internal Medicine

## 2020-06-26 ENCOUNTER — Ambulatory Visit (INDEPENDENT_AMBULATORY_CARE_PROVIDER_SITE_OTHER): Payer: Managed Care, Other (non HMO) | Admitting: Orthopaedic Surgery

## 2020-06-26 ENCOUNTER — Encounter: Payer: Self-pay | Admitting: Orthopaedic Surgery

## 2020-06-26 VITALS — Ht 64.5 in | Wt 206.2 lb

## 2020-06-26 DIAGNOSIS — M65312 Trigger thumb, left thumb: Secondary | ICD-10-CM

## 2020-06-26 MED ORDER — BUPIVACAINE HCL 0.5 % IJ SOLN
0.3300 mL | INTRAMUSCULAR | Status: AC | PRN
  Administered 2020-06-26: .33 mL

## 2020-06-26 MED ORDER — LIDOCAINE HCL 1 % IJ SOLN
0.3000 mL | INTRAMUSCULAR | Status: AC | PRN
  Administered 2020-06-26: .3 mL

## 2020-06-26 MED ORDER — METHYLPREDNISOLONE ACETATE 40 MG/ML IJ SUSP
13.3300 mg | INTRAMUSCULAR | Status: AC | PRN
  Administered 2020-06-26: 13.33 mg

## 2020-06-26 NOTE — Progress Notes (Signed)
Office Visit Note   Patient: Nicole House           Date of Birth: [birth year]           MRN: [id number] Visit Date: 06/26/2020              Requested by: No referring provider defined for this encounter. PCP: Ranae Pila, MD   Assessment & Plan: Visit Diagnoses:  1. Trigger thumb, left thumb     Plan: Impression is left thumb stenosing tenosynovitis.  Based on discussion of treatment options patient elected to have a cortisone injection today.  She will follow-up as needed.  Follow-Up Instructions: Return if symptoms worsen or fail to improve.   Orders:  No orders of the defined types were placed in this encounter.  No orders of the defined types were placed in this encounter.     Procedures: Hand/UE Inj: L thumb A1 for trigger finger on 06/26/2020 7:05 PM Indications: pain Details: 25 G needle Medications: 0.3 mL lidocaine 1 %; 0.33 mL bupivacaine 0.5 %; 13.33 mg methylPREDNISolone acetate 40 MG/ML Outcome: tolerated well, no immediate complications Consent was given by the patient. Patient was prepped and draped in the usual sterile fashion.       Clinical Data: No additional findings.   Subjective: Chief Complaint  Patient presents with  . Left Hand - Pain    Left thumb trigger finger.     Nicole House is a 44 year old female who is here for evaluation of a left painful thumb.  She endorses soreness and locking for the last 2 to 3 months without injury.  She has used Advil and heat which helps some.  He had she has trouble flexing her thumb secondary to the pain.   Review of Systems  Constitutional: Negative.   HENT: Negative.   Eyes: Negative.   Respiratory: Negative.   Cardiovascular: Negative.   Endocrine: Negative.   Musculoskeletal: Negative.   Neurological: Negative.   Hematological: Negative.   Psychiatric/Behavioral: Negative.   All other systems reviewed and are negative.    Objective: Vital Signs: Ht 5' 4.5" (1.638  m)   Wt 206 lb 3.2 oz (93.5 kg)   BMI 34.85 kg/m   Physical Exam Vitals and nursing note reviewed.  Constitutional:      Appearance: She is well-developed.  HENT:     Head: Normocephalic and atraumatic.  Pulmonary:     Effort: Pulmonary effort is normal.  Abdominal:     Palpations: Abdomen is soft.  Musculoskeletal:     Cervical back: Neck supple.  Skin:    General: Skin is warm.     Capillary Refill: Capillary refill takes less than 2 seconds.  Neurological:     Mental Status: She is alert and oriented to person, place, and time.  Psychiatric:        Behavior: Behavior normal.        Thought Content: Thought content normal.        Judgment: Judgment normal.     Ortho Exam Left thumb shows tenderness along the flexor tendon and the A1 pulley.  No neurovascular compromise.  Limited active and passive range of motion secondary to guarding. Specialty Comments:  No specialty comments available.  Imaging: No results found.   PMFS History: Patient Active Problem List   Diagnosis Date Noted  . UTI (urinary tract infection) 08/08/2013  . Trichomonal infection 08/08/2013  . Bacterial vaginosis 04/26/2013  . GERD (gastroesophageal reflux disease) 04/25/2013  .  Tinea pedis 04/25/2013  . Vaginal odor 04/25/2013  . History of abnormal Pap smear 04/25/2013   Past Medical History:  Diagnosis Date  . GERD (gastroesophageal reflux disease)     Family History  Problem Relation Age of Onset  . Cancer Maternal Grandmother        ?type  . Diabetes Maternal Grandmother   . Heart failure Maternal Grandmother   . Cancer Maternal Grandfather        ?type    Past Surgical History:  Procedure Laterality Date  . TUBAL LIGATION     Social History   Occupational History  . Not on file  Tobacco Use  . Smoking status: Former Smoker    Types: Cigarettes    Start date: 03/26/2013  . Smokeless tobacco: Never Used  Substance and Sexual Activity  . Alcohol use: No  . Drug use:  No  . Sexual activity: Not on file

## 2022-09-04 ENCOUNTER — Ambulatory Visit
Admission: EM | Admit: 2022-09-04 | Discharge: 2022-09-04 | Disposition: A | Discharge status: Home / Self Care | Payer: No Typology Code available for payment source | Attending: Physician Assistant | Admitting: Physician Assistant

## 2022-09-04 DIAGNOSIS — M549 Dorsalgia, unspecified: Secondary | ICD-10-CM

## 2022-09-04 DIAGNOSIS — M62838 Other muscle spasm: Secondary | ICD-10-CM

## 2022-09-04 DIAGNOSIS — M542 Cervicalgia: Secondary | ICD-10-CM

## 2022-09-04 DIAGNOSIS — L301 Dyshidrosis [pompholyx]: Secondary | ICD-10-CM | POA: Diagnosis not present

## 2022-09-04 MED ORDER — PREDNISONE 10 MG (21) PO TBPK: ORAL_TABLET | ORAL | 0 refills | Status: DC

## 2022-09-04 MED ORDER — BACLOFEN 10 MG PO TABS: 10.0000 mg | ORAL_TABLET | Freq: Two times a day (BID) | ORAL | 0 refills | Status: DC | PRN

## 2022-09-04 MED ORDER — TRIAMCINOLONE ACETONIDE 0.1 % EX CREA: 1.0000 | TOPICAL_CREAM | Freq: Two times a day (BID) | CUTANEOUS | 0 refills | Status: DC

## 2022-09-04 NOTE — ED Provider Notes (Signed)
[facility]    CSN: [id number] Arrival date & time: 09/04/22  1443      History   Chief Complaint Chief Complaint  Patient presents with   Torticollis    HPI Nicole House is a 46 y.o. female.   Patient presents today with a 4 to 5-day history of left-sided neck pain.  Reports that she was sleeping at his house underneath her and she pulled this and she believes that at that point she injured her neck.  She has had ongoing pain since that time.  She did take her husband's methocarbamol and found that this made her feel funny and caused her to have some numbness in her right foot.  This has still been a problem and has not resolved.  She reports pain is rated 9 on a 0-10 pain scale, localized to left neck with radiation into thoracic back as well as throughout her back, described as aching, worse with movement or palpation, no alleviating factors identified.  Denies previous injury or surgery involving neck or back.  She is able to ambulate unassisted.  She has missed work as result of symptoms.  Denies any bowel/bladder incontinence, lower extremity weakness, saddle anesthesia.  In addition, patient reports a several year history of intermittent pruritic rash on her foot.  She reports sometimes she will get small vesicles as well as itching and scaling skin.  She has not been evaluated for this in the past.  She has tried topical over-the-counter medications for tinea pedis without improvement of symptoms.  She denies history of dermatological condition.  She does not have a primary care provider.    Past Medical History:  Diagnosis Date   GERD (gastroesophageal reflux disease)     Patient Active Problem List   Diagnosis Date Noted   UTI (urinary tract infection) 08/08/2013   Trichomonal infection 08/08/2013   Bacterial vaginosis 04/26/2013   GERD (gastroesophageal reflux disease) 04/25/2013   Tinea pedis 04/25/2013   Vaginal odor 04/25/2013   History of  abnormal Pap smear 04/25/2013    Past Surgical History:  Procedure Laterality Date   TUBAL LIGATION      OB History   No obstetric history on file.      Home Medications    Prior to Admission medications   Medication Sig Start Date End Date Taking? Authorizing Provider  baclofen (LIORESAL) 10 MG tablet Take 1 tablet (10 mg total) by mouth 2 (two) times daily as needed for muscle spasms. 09/04/22  Yes Avary Pitsenbarger K, PA-C  predniSONE (STERAPRED UNI-PAK 21 TAB) 10 MG (21) TBPK tablet As directed 09/04/22  Yes Sinclaire Artiga K, PA-C  triamcinolone cream (KENALOG) 0.1 % Apply 1 Application topically 2 (two) times daily. 09/04/22  Yes Rhonda Vangieson, Denny Peon K, PA-C  omeprazole (PRILOSEC) 20 MG capsule Take 1 capsule (20 mg total) by mouth daily. 04/25/13   McLean-Scocuzza, Pasty Spillers, MD    Family History Family History  Problem Relation Age of Onset   Cancer Maternal Grandmother        ?type   Diabetes Maternal Grandmother    Heart failure Maternal Grandmother    Cancer Maternal Grandfather        ?type    Social History Social History   Tobacco Use   Smoking status: Former    Types: Cigarettes    Start date: 03/26/2013   Smokeless tobacco: Never  Substance Use Topics   Alcohol use: No   Drug use: No  Allergies   Patient has no known allergies.   Review of Systems Review of Systems  Constitutional:  Positive for activity change. Negative for appetite change, fatigue and fever.  Gastrointestinal:  Negative for abdominal pain, diarrhea and nausea.  Musculoskeletal:  Positive for back pain and neck pain. Negative for arthralgias and myalgias.  Skin:  Positive for rash. Negative for wound.  Neurological:  Positive for numbness (Right foot). Negative for dizziness, weakness, light-headedness and headaches.     Physical Exam Triage Vital Signs ED Triage Vitals  Enc Vitals Group     BP 09/04/22 1605 119/81     Pulse Rate 09/04/22 1605 (!) 57     Resp 09/04/22 1605 18      Temp 09/04/22 1605 97.7 F (36.5 C)     Temp Source 09/04/22 1605 Oral     SpO2 09/04/22 1605 95 %     Weight --      Height --      Head Circumference --      Peak Flow --      Pain Score 09/04/22 1603 9     Pain Loc --      Pain Edu? --      Excl. in GC? --    No data found.  Updated Vital Signs BP 119/81 (BP Location: Right Arm)   Pulse (!) 57   Temp 97.7 F (36.5 C) (Oral)   Resp 18   LMP 08/30/2022   SpO2 95%   Visual Acuity Right Eye Distance:   Left Eye Distance:   Bilateral Distance:    Right Eye Near:   Left Eye Near:    Bilateral Near:     Physical Exam Vitals reviewed.  Constitutional:      General: She is awake. She is not in acute distress.    Appearance: Normal appearance. She is well-developed. She is not ill-appearing.     Comments: Very pleasant female appears stated age in no acute distress sitting comfortably in exam room  HENT:     Head: Normocephalic and atraumatic.  Cardiovascular:     Rate and Rhythm: Normal rate and regular rhythm.     Pulses:          Posterior tibial pulses are 2+ on the right side and 2+ on the left side.     Heart sounds: Normal heart sounds, S1 normal and S2 normal. No murmur heard. Pulmonary:     Effort: Pulmonary effort is normal.     Breath sounds: Normal breath sounds. No wheezing, rhonchi or rales.     Comments: Clear to auscultation bilaterally Musculoskeletal:     Cervical back: Spasms and tenderness present. No bony tenderness.     Thoracic back: Tenderness present. No bony tenderness.     Lumbar back: Tenderness present. No bony tenderness. Negative right straight leg raise test and negative left straight leg raise test.     Comments: Back: No pain percussion of vertebrae.  Tenderness palpation over left trapezius and along paraspinal muscles.  Strength 5/5 bilateral upper and lower extremities.  No deformity or step-off noted.  Feet:     Right foot:     Protective Sensation: 10 sites tested.  10 sites  sensed.     Comments: Right foot: Neurovascularly intact with normal sensation. Skin:    Findings: Rash present. Rash is scaling.     Comments: Scaling rash noted medial right foot.  Psychiatric:        Behavior: Behavior is cooperative.  UC Treatments / Results  Labs (all labs ordered are listed, but only abnormal results are displayed) Labs Reviewed - No data to display  EKG   Radiology No results found.  Procedures Procedures (including critical care time)  Medications Ordered in UC Medications - No data to display  Initial Impression / Assessment and Plan / UC Course  I have reviewed the triage vital signs and the nursing notes.  Pertinent labs & imaging results that were available during my care of the patient were reviewed by me and considered in my medical decision making (see chart for details).     Patient denies any recent trauma and has no significant bony tenderness/deformity so plain films were deferred.  Given symptoms of been ongoing for several days I am concerned that she is experiencing right lower back pain causing sciatica due to compensating for the left neck pain.  Her findings are vascular intact with no indication for emergent evaluation or imaging.  We will treat with prednisone taper as she has been taking ibuprofen without improvement.  She was instructed not to take NSAIDs with this medication including aspirin, ibuprofen/Advil, naproxen/Aleve.  Can use Tylenol for breakthrough pain.  She was started on baclofen for pain with instruction not to drive or drink alcohol with taking this medication as drowsiness is a common side effect.  Recommended heat, rest, stretch.  Encouraged gentle movement to prevent stiffness.  She is to follow-up with sports medicine was given contact information for local provider with instruction to call to schedule an appointment.  Discussed that she should be reevaluated immediately with any concerning symptoms including  bowel/bladder incontinence, lower extremity weakness, saddle anesthesia, alteration in numbness.  Strict return precautions given.  Work excuse note provided.  Suspect symptoms are related to dyshidrotic eczema.  She was encouraged to keep area clean and make sure that she is changing her footwear regularly.  She was prescribed triamcinolone to help manage symptoms.  Discussed that if her symptoms are not improving she should return for reevaluation.  Final Clinical Impressions(s) / UC Diagnoses   Final diagnoses:  Trapezius muscle spasm  Neck pain  Acute bilateral back pain, unspecified back location  Dyshidrotic eczema     Discharge Instructions      Take baclofen up to twice a day.  This will make you sleepy do not drive or drink alcohol with taking it.  Take prednisone as prescribed.  Do not take NSAIDs with this medication including aspirin, ibuprofen/Advil, naproxen/Aleve.  You can use Tylenol.  Use heat and gentle stretch for symptom relief.  Follow-up with sports medicine; call to schedule an appointment with them.  If anything worsens and you have worsening numbness, difficulty walking, increased pain, weakness you need to be seen immediately.  Use cream twice daily as needed on your rash.  Keep your feet dry as much as possible.  If symptoms are improving please return for reevaluation.     ED Prescriptions     Medication Sig Dispense Auth. Provider   predniSONE (STERAPRED UNI-PAK 21 TAB) 10 MG (21) TBPK tablet As directed 21 tablet Jacen Carlini K, PA-C   baclofen (LIORESAL) 10 MG tablet Take 1 tablet (10 mg total) by mouth 2 (two) times daily as needed for muscle spasms. 30 each Trig Mcbryar K, PA-C   triamcinolone cream (KENALOG) 0.1 % Apply 1 Application topically 2 (two) times daily. 30 g Rubbie Goostree, Noberto RetortErin K, PA-C      PDMP not reviewed this encounter.   Sims Laday,  Derry Skill, PA-C 09/04/22 1640

## 2022-09-04 NOTE — ED Triage Notes (Signed)
Pt presents with neck pain X 4 days.

## 2022-09-04 NOTE — Discharge Instructions (Signed)
Take baclofen up to twice a day.  This will make you sleepy do not drive or drink alcohol with taking it.  Take prednisone as prescribed.  Do not take NSAIDs with this medication including aspirin, ibuprofen/Advil, naproxen/Aleve.  You can use Tylenol.  Use heat and gentle stretch for symptom relief.  Follow-up with sports medicine; call to schedule an appointment with them.  If anything worsens and you have worsening numbness, difficulty walking, increased pain, weakness you need to be seen immediately.  Use cream twice daily as needed on your rash.  Keep your feet dry as much as possible.  If symptoms are improving please return for reevaluation.

## 2022-09-16 ENCOUNTER — Encounter (HOSPITAL_BASED_OUTPATIENT_CLINIC_OR_DEPARTMENT_OTHER): Payer: Self-pay | Admitting: Pediatrics

## 2022-09-16 ENCOUNTER — Other Ambulatory Visit: Payer: Self-pay

## 2022-09-16 ENCOUNTER — Emergency Department (HOSPITAL_BASED_OUTPATIENT_CLINIC_OR_DEPARTMENT_OTHER): Payer: No Typology Code available for payment source

## 2022-09-16 ENCOUNTER — Emergency Department (HOSPITAL_BASED_OUTPATIENT_CLINIC_OR_DEPARTMENT_OTHER)
Admission: EM | Admit: 2022-09-16 | Discharge: 2022-09-17 | Disposition: A | Discharge status: Home / Self Care | Payer: No Typology Code available for payment source | Attending: Emergency Medicine | Admitting: Emergency Medicine

## 2022-09-16 DIAGNOSIS — R5383 Other fatigue: Secondary | ICD-10-CM | POA: Insufficient documentation

## 2022-09-16 DIAGNOSIS — M542 Cervicalgia: Secondary | ICD-10-CM | POA: Insufficient documentation

## 2022-09-16 DIAGNOSIS — M5412 Radiculopathy, cervical region: Secondary | ICD-10-CM

## 2022-09-16 DIAGNOSIS — R200 Anesthesia of skin: Secondary | ICD-10-CM | POA: Insufficient documentation

## 2022-09-16 DIAGNOSIS — R531 Weakness: Secondary | ICD-10-CM | POA: Insufficient documentation

## 2022-09-16 LAB — CBC WITH DIFFERENTIAL/PLATELET
Abs Immature Granulocytes: 0.02 10*3/uL (ref 0.00–0.07)
Basophils Absolute: 0 10*3/uL (ref 0.0–0.1)
Basophils Relative: 0 %
Eosinophils Absolute: 0.3 10*3/uL (ref 0.0–0.5)
Eosinophils Relative: 3 %
HCT: 35 % — ABNORMAL LOW (ref 36.0–46.0)
Hemoglobin: 11.3 g/dL — ABNORMAL LOW (ref 12.0–15.0)
Immature Granulocytes: 0 %
Lymphocytes Relative: 41 %
Lymphs Abs: 3.9 10*3/uL (ref 0.7–4.0)
MCH: 27.6 pg (ref 26.0–34.0)
MCHC: 32.3 g/dL (ref 30.0–36.0)
MCV: 85.6 fL (ref 80.0–100.0)
Monocytes Absolute: 0.5 10*3/uL (ref 0.1–1.0)
Monocytes Relative: 5 %
Neutro Abs: 4.7 10*3/uL (ref 1.7–7.7)
Neutrophils Relative %: 51 %
Platelets: 281 10*3/uL (ref 150–400)
RBC: 4.09 MIL/uL (ref 3.87–5.11)
RDW: 14.6 % (ref 11.5–15.5)
WBC: 9.5 10*3/uL (ref 4.0–10.5)
nRBC: 0 % (ref 0.0–0.2)

## 2022-09-16 LAB — COMPREHENSIVE METABOLIC PANEL
ALT: 12 U/L (ref 0–44)
AST: 20 U/L (ref 15–41)
Albumin: 3.8 g/dL (ref 3.5–5.0)
Alkaline Phosphatase: 54 U/L (ref 38–126)
Anion gap: 8 (ref 5–15)
BUN: 10 mg/dL (ref 6–20)
CO2: 22 mmol/L (ref 22–32)
Calcium: 8.6 mg/dL — ABNORMAL LOW (ref 8.9–10.3)
Chloride: 106 mmol/L (ref 98–111)
Creatinine, Ser: 0.76 mg/dL (ref 0.44–1.00)
GFR, Estimated: >60 mL/min (ref >60)
Glucose, Bld: 101 mg/dL — ABNORMAL HIGH (ref 70–99)
Potassium: 4.7 mmol/L (ref 3.5–5.1)
Sodium: 136 mmol/L (ref 135–145)
Total Bilirubin: 0.8 mg/dL (ref 0.3–1.2)
Total Protein: 7.3 g/dL (ref 6.5–8.1)

## 2022-09-16 LAB — HCG, QUANTITATIVE, PREGNANCY: hCG, Beta Chain, Quant, S: <1 m[IU]/mL (ref <5)

## 2022-09-16 NOTE — ED Provider Notes (Signed)
Transferred here from [facility] for MRI's due to numbness/tingling on right side.  Plan for MRI brain, cervical, and thoracic spine W/ and W/out contrast.  Patient updated, no acute changes in symptoms since time of arrival.  Results for orders placed or performed during the hospital encounter of 09/16/22  CBC with Differential  Result Value Ref Range   WBC 9.5 4.0 - 10.5 K/uL   RBC 4.09 3.87 - 5.11 MIL/uL   Hemoglobin 11.3 (L) 12.0 - 15.0 g/dL   HCT 35.0 (L) 36.0 - 46.0 %   MCV 85.6 80.0 - 100.0 fL   MCH 27.6 26.0 - 34.0 pg   MCHC 32.3 30.0 - 36.0 g/dL   RDW 14.6 11.5 - 15.5 %   Platelets 281 150 - 400 K/uL   nRBC 0.0 0.0 - 0.2 %   Neutrophils Relative % 51 %   Neutro Abs 4.7 1.7 - 7.7 K/uL   Lymphocytes Relative 41 %   Lymphs Abs 3.9 0.7 - 4.0 K/uL   Monocytes Relative 5 %   Monocytes Absolute 0.5 0.1 - 1.0 K/uL   Eosinophils Relative 3 %   Eosinophils Absolute 0.3 0.0 - 0.5 K/uL   Basophils Relative 0 %   Basophils Absolute 0.0 0.0 - 0.1 K/uL   Immature Granulocytes 0 %   Abs Immature Granulocytes 0.02 0.00 - 0.07 K/uL  Comprehensive metabolic panel  Result Value Ref Range   Sodium 136 135 - 145 mmol/L   Potassium 4.7 3.5 - 5.1 mmol/L   Chloride 106 98 - 111 mmol/L   CO2 22 22 - 32 mmol/L   Glucose, Bld 101 (H) 70 - 99 mg/dL   BUN 10 6 - 20 mg/dL   Creatinine, Ser 0.76 0.44 - 1.00 mg/dL   Calcium 8.6 (L) 8.9 - 10.3 mg/dL   Total Protein 7.3 6.5 - 8.1 g/dL   Albumin 3.8 3.5 - 5.0 g/dL   AST 20 15 - 41 U/L   ALT 12 0 - 44 U/L   Alkaline Phosphatase 54 38 - 126 U/L   Total Bilirubin 0.8 0.3 - 1.2 mg/dL   GFR, Estimated >60 >60 mL/min   Anion gap 8 5 - 15  hCG, quantitative, pregnancy  Result Value Ref Range   hCG, Beta Chain, Quant, S <1 <5 mIU/mL   MR Cervical Spine W or Wo Contrast  Result Date: 09/17/2022 CLINICAL DATA:  Right foot numbness.  Neck pain. EXAM: MRI HEAD WITHOUT AND WITH CONTRAST MRI CERVICAL SPINE WITHOUT AND WITH CONTRAST MRI CERVICAL THORACIC  WITHOUT AND CONTRAST TECHNIQUE: Multiplanar, multiecho pulse sequences of the brain and surrounding structures, and cervical and thoracic spine were obtained without and with intravenous contrast. COMPARISON:  None Available. FINDINGS: MRI HEAD FINDINGS Brain: No acute infarct, mass effect or extra-axial collection. No acute or chronic hemorrhage. Normal white matter signal, parenchymal volume and CSF spaces. The midline structures are normal. There is no abnormal contrast enhancement. Vascular: Major flow voids are preserved. Skull and upper cervical spine: Normal calvarium and skull base. Visualized upper cervical spine and soft tissues are normal. Sinuses/Orbits:No paranasal sinus fluid levels or advanced mucosal thickening. No mastoid or middle ear effusion. Normal orbits. MRI CERVICAL SPINE FINDINGS Alignment: Physiologic. Vertebrae: No fracture, evidence of discitis, or bone lesion. Cord: Normal signal Posterior Fossa, vertebral arteries, paraspinal tissues: Negative Disc levels: C1-2: Unremarkable. C2-3: Normal disc space and facet joints. There is no spinal canal stenosis. No neural foraminal stenosis. C3-4: Large left subarticular disc extrusion with superior migration  causes compression of the left hemicord. Severe left spinal canal stenosis. No neural foraminal stenosis. C4-5: Normal disc space and facet joints. There is no spinal canal stenosis. No neural foraminal stenosis. C5-6: Small disc bulge with right-greater-than-left uncovertebral hypertrophy. There is no spinal canal stenosis. Mild right neural foraminal stenosis. C6-7: Normal disc space and facet joints. There is no spinal canal stenosis. No neural foraminal stenosis. C7-T1: Normal disc space and facet joints. There is no spinal canal stenosis. No neural foraminal stenosis. MRI THORACIC SPINE FINDINGS Alignment:  Physiologic. Vertebrae: No fracture, evidence of discitis, or bone lesion. Cord:  Normal signal and morphology. Paraspinal and other  soft tissues: Negative. Disc levels: No spinal canal stenosis IMPRESSION: 1. Normal MRI of the brain. 2. Large left subarticular disc extrusion with superior migration at C3-4 causes compression of the left hemicord and severe left spinal canal stenosis. 3. Mild right C5-6 neural foraminal stenosis. 4. No thoracic spinal canal or neural foraminal stenosis. Critical Value/emergent results were called by telephone at the time of interpretation on 09/17/2022 at 2:02 am to Dr. Wilkie Aye, Who verbally acknowledged these results. Electronically Signed   By: Deatra Robinson M.D.   On: 09/17/2022 02:02   MR THORACIC SPINE W WO CONTRAST  Result Date: 09/17/2022 CLINICAL DATA:  Right foot numbness.  Neck pain. EXAM: MRI HEAD WITHOUT AND WITH CONTRAST MRI CERVICAL SPINE WITHOUT AND WITH CONTRAST MRI CERVICAL THORACIC WITHOUT AND CONTRAST TECHNIQUE: Multiplanar, multiecho pulse sequences of the brain and surrounding structures, and cervical and thoracic spine were obtained without and with intravenous contrast. COMPARISON:  None Available. FINDINGS: MRI HEAD FINDINGS Brain: No acute infarct, mass effect or extra-axial collection. No acute or chronic hemorrhage. Normal white matter signal, parenchymal volume and CSF spaces. The midline structures are normal. There is no abnormal contrast enhancement. Vascular: Major flow voids are preserved. Skull and upper cervical spine: Normal calvarium and skull base. Visualized upper cervical spine and soft tissues are normal. Sinuses/Orbits:No paranasal sinus fluid levels or advanced mucosal thickening. No mastoid or middle ear effusion. Normal orbits. MRI CERVICAL SPINE FINDINGS Alignment: Physiologic. Vertebrae: No fracture, evidence of discitis, or bone lesion. Cord: Normal signal Posterior Fossa, vertebral arteries, paraspinal tissues: Negative Disc levels: C1-2: Unremarkable. C2-3: Normal disc space and facet joints. There is no spinal canal stenosis. No neural foraminal stenosis. C3-4:  Large left subarticular disc extrusion with superior migration causes compression of the left hemicord. Severe left spinal canal stenosis. No neural foraminal stenosis. C4-5: Normal disc space and facet joints. There is no spinal canal stenosis. No neural foraminal stenosis. C5-6: Small disc bulge with right-greater-than-left uncovertebral hypertrophy. There is no spinal canal stenosis. Mild right neural foraminal stenosis. C6-7: Normal disc space and facet joints. There is no spinal canal stenosis. No neural foraminal stenosis. C7-T1: Normal disc space and facet joints. There is no spinal canal stenosis. No neural foraminal stenosis. MRI THORACIC SPINE FINDINGS Alignment:  Physiologic. Vertebrae: No fracture, evidence of discitis, or bone lesion. Cord:  Normal signal and morphology. Paraspinal and other soft tissues: Negative. Disc levels: No spinal canal stenosis IMPRESSION: 1. Normal MRI of the brain. 2. Large left subarticular disc extrusion with superior migration at C3-4 causes compression of the left hemicord and severe left spinal canal stenosis. 3. Mild right C5-6 neural foraminal stenosis. 4. No thoracic spinal canal or neural foraminal stenosis. Critical Value/emergent results were called by telephone at the time of interpretation on 09/17/2022 at 2:02 am to Dr. Wilkie Aye, Who verbally acknowledged these results. Electronically  Signed   By: Deatra Robinson M.D.   On: 09/17/2022 02:02   MR Brain W and Wo Contrast  Result Date: 09/17/2022 CLINICAL DATA:  Right foot numbness.  Neck pain. EXAM: MRI HEAD WITHOUT AND WITH CONTRAST MRI CERVICAL SPINE WITHOUT AND WITH CONTRAST MRI CERVICAL THORACIC WITHOUT AND CONTRAST TECHNIQUE: Multiplanar, multiecho pulse sequences of the brain and surrounding structures, and cervical and thoracic spine were obtained without and with intravenous contrast. COMPARISON:  None Available. FINDINGS: MRI HEAD FINDINGS Brain: No acute infarct, mass effect or extra-axial collection. No  acute or chronic hemorrhage. Normal white matter signal, parenchymal volume and CSF spaces. The midline structures are normal. There is no abnormal contrast enhancement. Vascular: Major flow voids are preserved. Skull and upper cervical spine: Normal calvarium and skull base. Visualized upper cervical spine and soft tissues are normal. Sinuses/Orbits:No paranasal sinus fluid levels or advanced mucosal thickening. No mastoid or middle ear effusion. Normal orbits. MRI CERVICAL SPINE FINDINGS Alignment: Physiologic. Vertebrae: No fracture, evidence of discitis, or bone lesion. Cord: Normal signal Posterior Fossa, vertebral arteries, paraspinal tissues: Negative Disc levels: C1-2: Unremarkable. C2-3: Normal disc space and facet joints. There is no spinal canal stenosis. No neural foraminal stenosis. C3-4: Large left subarticular disc extrusion with superior migration causes compression of the left hemicord. Severe left spinal canal stenosis. No neural foraminal stenosis. C4-5: Normal disc space and facet joints. There is no spinal canal stenosis. No neural foraminal stenosis. C5-6: Small disc bulge with right-greater-than-left uncovertebral hypertrophy. There is no spinal canal stenosis. Mild right neural foraminal stenosis. C6-7: Normal disc space and facet joints. There is no spinal canal stenosis. No neural foraminal stenosis. C7-T1: Normal disc space and facet joints. There is no spinal canal stenosis. No neural foraminal stenosis. MRI THORACIC SPINE FINDINGS Alignment:  Physiologic. Vertebrae: No fracture, evidence of discitis, or bone lesion. Cord:  Normal signal and morphology. Paraspinal and other soft tissues: Negative. Disc levels: No spinal canal stenosis IMPRESSION: 1. Normal MRI of the brain. 2. Large left subarticular disc extrusion with superior migration at C3-4 causes compression of the left hemicord and severe left spinal canal stenosis. 3. Mild right C5-6 neural foraminal stenosis. 4. No thoracic  spinal canal or neural foraminal stenosis. Critical Value/emergent results were called by telephone at the time of interpretation on 09/17/2022 at 2:02 am to Dr. Wilkie Aye, Who verbally acknowledged these results. Electronically Signed   By: Deatra Robinson M.D.   On: 09/17/2022 02:02   CT Head Wo Contrast  Result Date: 09/16/2022 CLINICAL DATA:  Left-sided neck pain and stiffness. EXAM: CT HEAD WITHOUT CONTRAST TECHNIQUE: Contiguous axial images were obtained from the base of the skull through the vertex without intravenous contrast. RADIATION DOSE REDUCTION: This exam was performed according to the departmental dose-optimization program which includes automated exposure control, adjustment of the mA and/or kV according to patient size and/or use of iterative reconstruction technique. COMPARISON:  None Available. FINDINGS: Brain: No evidence of acute infarction, hemorrhage, hydrocephalus, extra-axial collection or mass lesion/mass effect. A 9 mm x 6 mm x 3 mm well-defined focus of low attenuation is seen within the medial aspect of the right parietal lobe (axial CT image 19, CT series 2/sagittal reformatted image 25, CT series 5). This appears to represent a focal area of asymmetric sulcal prominence. Vascular: No hyperdense vessel or unexpected calcification. Skull: Normal. Negative for fracture or focal lesion. Sinuses/Orbits: Very mild bilateral maxillary sinus and moderate severity bilateral ethmoid sinus mucosal thickening is seen. Other: None. IMPRESSION: 1.  No acute intracranial abnormality. 2. Very mild bilateral maxillary sinus and moderate severity bilateral ethmoid sinus mucosal thickening. Electronically Signed   By: Aram Candela M.D.   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  Normal cord signal in C/T spine.  No other abnormal findings.  Results discussed with patient and husband, she acknowledged understanding.  Feel she is stable for discharge with  outpatient neurosurgery follow-up.  Rx medrol dosepak and percocet.  Given contact information for neurosurgery office.  Can return here for any new/acute changes.   Garlon Hatchet, PA-C 09/17/22 0240    Shon Baton, MD 09/17/22 [phone]

## 2022-09-16 NOTE — ED Provider Notes (Signed)
[facility] Provider Note   CSN: [id number] Arrival date & time: 09/16/22  1419     History {Add pertinent medical, surgical, social history, OB history to HPI:1} Chief Complaint  Patient presents with   Torticollis   Numbness    Nicole House is a 46 y.o. female.  HPI     Pain left side of neck Numbness of right side up until mid thoracic region Started 9/18, 2 weeks No numbness on left Right leg feels weak Left shoulder seems weak, no weakness to other left arm Hurt neck Numbness getting worse, stumbling, falling, right foot tingling and burning.  Can't tell the temperature on the right.  TO sit in the tub it is painful because of pain to that whole right side Stopped taking the muscle relaxer because thought it was causing it, was seen 9/21 for the left sided neck pain, hurt neck but not sure how, thinks did it in sleep, laying on towel went to lift it up and had pain, can't even perform job because of pain, can't sleep, can't sleep or get comfortable, pain all the time Not sure why body is numb No arm numbness, no breast numbness but from the foot up to the back it is numb on the right One week ago had fever, couldn't catch breath but that improved, body aches then too Sweating more than normal, change in vision for about one month, just blurry ,  not double or visual field, just vision itself is alittle worse, can't read small print well anymore  No falls, trauma, is off balance with numbness of right lower extremity and numbness  No loss of control of bowel or bladder Uncle has MS No hx of IVDU  No cp Easily fatigued No cough, runny nose, diarrhea     Home Medications Prior to Admission medications   Medication Sig Start Date End Date Taking? Authorizing Provider  baclofen (LIORESAL) 10 MG tablet Take 1 tablet (10 mg total) by mouth 2 (two) times daily as needed for muscle spasms. 09/04/22   Raspet, Derry Skill, PA-C  omeprazole  (PRILOSEC) 20 MG capsule Take 1 capsule (20 mg total) by mouth daily. 04/25/13   McLean-Scocuzza, Nino Glow, MD  predniSONE (STERAPRED UNI-PAK 21 TAB) 10 MG (21) TBPK tablet As directed 09/04/22   Raspet, Nieshia Larmon K, PA-C  triamcinolone cream (KENALOG) 0.1 % Apply 1 Application topically 2 (two) times daily. 09/04/22   Raspet, Derry Skill, PA-C      Allergies    Patient has no known allergies.    Review of Systems   Review of Systems  Physical Exam Updated Vital Signs BP 112/60 (BP Location: Right Arm)   Pulse 63   Temp 98.4 F (36.9 C) (Oral)   Resp 18   Ht 5\' 4"  (1.626 m)   Wt 97.5 kg   LMP 08/30/2022   SpO2 98%   BMI 36.90 kg/m  Physical Exam  ED Results / Procedures / Treatments   Labs (all labs ordered are listed, but only abnormal results are displayed) Labs Reviewed - No data to display  EKG None  Radiology No results found.  Procedures Procedures  {Document cardiac monitor, telemetry assessment procedure when appropriate:1}  Medications Ordered in ED Medications - No data to display  ED Course/ Medical Decision Making/ A&P                           Medical Decision Making  ***  {  Document critical care time when appropriate:1} {Document review of labs and clinical decision tools ie heart score, Chads2Vasc2 etc:1}  {Document your independent review of radiology images, and any outside records:1} {Document your discussion with family members, caretakers, and with consultants:1} {Document social determinants of health affecting pt's care:1} {Document your decision making why or why not admission, treatments were needed:1} Final Clinical Impression(s) / ED Diagnoses Final diagnoses:  None    Rx / DC Orders ED Discharge Orders     None

## 2022-09-16 NOTE — ED Triage Notes (Addendum)
C/O left sided neck pain and stiffness radiating down to shoulder ball. Stating started a few weeks ago and muscle relaxant was prescribed when seen at [facility]; but caused some tingling sensation on right lower extremity so she stopped taking it for a day, but the tingling  / numbness / burning sensation on legs stays. Reported hot water feels cold on right side in comparison to the left.

## 2022-09-17 ENCOUNTER — Emergency Department (HOSPITAL_COMMUNITY): Payer: No Typology Code available for payment source

## 2022-09-17 MED ORDER — OXYCODONE-ACETAMINOPHEN 5-325 MG PO TABS
2.0000 | ORAL_TABLET | Freq: Once | ORAL | Status: AC
  Administered 2022-09-17: 2 via ORAL
  Filled 2022-09-17: qty 2

## 2022-09-17 MED ORDER — METHYLPREDNISOLONE 4 MG PO TBPK: ORAL_TABLET | ORAL | 0 refills | Status: DC

## 2022-09-17 MED ORDER — OXYCODONE-ACETAMINOPHEN 5-325 MG PO TABS: 1.0000 | ORAL_TABLET | ORAL | 0 refills | Status: DC | PRN

## 2022-09-17 MED ORDER — GADOPICLENOL 0.5 MMOL/ML IV SOLN
9.7000 mL | Freq: Once | INTRAVENOUS | Status: AC | PRN
  Administered 2022-09-17: 9.7 mL via INTRAVENOUS

## 2022-09-17 NOTE — ED Notes (Signed)
Patient transported to MRI 

## 2022-09-17 NOTE — Discharge Instructions (Signed)
Take the prescribed medication as directed. Follow-up with neurosurgery-- call in the morning to get appt scheduled. Return to the ED for new or worsening symptoms.

## 2022-09-17 NOTE — ED Notes (Signed)
Patient verbalizes understanding of discharge instructions. Opportunity for questioning and answers were provided. Armband removed by staff, pt discharged from ED. Pt taken to ED waiting room via wheel chair.  

## 2022-09-22 ENCOUNTER — Other Ambulatory Visit: Payer: Self-pay | Admitting: Neurosurgery

## 2022-09-25 NOTE — Pre-Procedure Instructions (Signed)
Surgical Instructions    Your procedure is scheduled on Wednesday October 18.  Report to [facility] Main Entrance "A" at 10:30 A.M., then check in with the Admitting office.  Call this number if you have problems the morning of surgery:  [phone]  If you have any questions prior to your surgery date call [phone]: Open Monday-Friday 8am-4pm  If you experience any cold or flu symptoms such as cough, fever, chills, shortness of breath, etc. between now and your scheduled surgery, please notify us at the above number     Remember:  Do not eat or drink after midnight the night before your surgery    Take these medicines the morning of surgery with A SIP OF WATER:   IF NEEDED: oxyCODONE-acetaminophen (PERCOCET)  As of today, STOP taking any Aspirin (unless otherwise instructed by your surgeon) Aleve, Naproxen, Ibuprofen, Motrin, Advil, Goody's, BC's, all herbal medications, fish oil, and all vitamins.         Do NOT Smoke (Tobacco/Vaping)  24 hours prior to your procedure  If you use a CPAP at night, you may bring your mask for your overnight stay.   Contacts, glasses, hearing aids, dentures or partials may not be worn into surgery, please bring cases for these belongings   For patients admitted to the hospital, discharge time will be determined by your treatment team.   Patients discharged the day of surgery will not be allowed to drive home, and someone needs to stay with them for 24 hours.   SURGICAL WAITING ROOM VISITATION Patients having surgery or a procedure may have no more than 2 support people in the waiting area - these visitors may rotate.   Children under the age of 22 must have an adult with them who is not the patient. If the patient needs to stay at the hospital during part of their recovery, the visitor guidelines for inpatient rooms apply. Pre-op nurse will coordinate an appropriate time for 1 support person to accompany patient in pre-op.  This support  person may not rotate.   Please refer to the [facility] website for the visitor guidelines for Inpatients (after your surgery is over and you are in a regular room).    Special instructions:    Oral Hygiene is also important to reduce your risk of infection.  Remember - BRUSH YOUR TEETH THE MORNING OF SURGERY WITH YOUR REGULAR TOOTHPASTE   [facility]- Preparing For Surgery  Before surgery, you can play an important role. Because skin is not sterile, your skin needs to be as free of germs as possible. You can reduce the number of germs on your skin by washing with CHG (chlorahexidine gluconate) Soap before surgery.  CHG is an antiseptic cleaner which kills germs and bonds with the skin to continue killing germs even after washing.     Please do not use if you have an allergy to CHG or antibacterial soaps. If your skin becomes reddened/irritated stop using the CHG.  Do not shave (including legs and underarms) for at least 48 hours prior to first CHG shower. It is OK to shave your face.  Please follow these instructions carefully.     Shower the NIGHT BEFORE SURGERY and the MORNING OF SURGERY with CHG Soap.   If you chose to wash your hair, wash your hair first as usual with your normal shampoo. After you shampoo, rinse your hair and body thoroughly to remove the shampoo.  Then [employer] and genitals (private parts) with your  normal soap and rinse thoroughly to remove soap.  After that Use CHG Soap as you would any other liquid soap. You can apply CHG directly to the skin and wash gently with a scrungie or a clean washcloth.   Apply the CHG Soap to your body ONLY FROM THE NECK DOWN.  Do not use on open wounds or open sores. Avoid contact with your eyes, ears, mouth and genitals (private parts). Wash Face and genitals (private parts)  with your normal soap.   Wash thoroughly, paying special attention to the area where your surgery will be performed.  Thoroughly rinse your body with warm  water from the neck down.  DO NOT shower/wash with your normal soap after using and rinsing off the CHG Soap.  Pat yourself dry with a CLEAN TOWEL.  Wear CLEAN PAJAMAS to bed the night before surgery  Place CLEAN SHEETS on your bed the night before your surgery  DO NOT SLEEP WITH PETS.   Day of Surgery:  Take a shower with CHG soap. Wear Clean/Comfortable clothing the morning of surgery Remember to brush your teeth WITH YOUR REGULAR TOOTHPASTE. Do not wear jewelry or makeup. Do not wear lotions, powders, perfumes or deodorant. Do not shave 48 hours prior to surgery. Do not bring valuables to the hospital. [facility] is not responsible for any belongings or valuables.  Do not wear nail polish, gel polish, artificial nails, or any other type of covering on natural nails (fingers and toes) If you have artificial nails or gel coating that need to be removed by a nail salon, please have this removed prior to surgery. Artificial nails or gel coating may interfere with anesthesia's ability to adequately monitor your vital signs.    If you received a COVID test during your pre-op visit, it is requested that you wear a mask when out in public, stay away from anyone that may not be feeling well, and notify your surgeon if you develop symptoms. If you have been in contact with anyone that has tested positive in the last 10 days, please notify your surgeon.    Please read over the following fact sheets that you were given.

## 2022-09-26 ENCOUNTER — Encounter (HOSPITAL_COMMUNITY): Payer: Self-pay

## 2022-09-26 ENCOUNTER — Other Ambulatory Visit: Payer: Self-pay

## 2022-09-26 ENCOUNTER — Encounter (HOSPITAL_COMMUNITY)
Admission: RE | Admit: 2022-09-26 | Discharge: 2022-09-26 | Disposition: A | Discharge status: Home / Self Care | Payer: No Typology Code available for payment source | Source: Ambulatory Visit | Attending: Neurosurgery | Admitting: Neurosurgery

## 2022-09-26 VITALS — BP 120/82 | HR 70 | Temp 99.2°F | Resp 18 | Ht 64.0 in | Wt 213.9 lb

## 2022-09-26 DIAGNOSIS — Z01812 Encounter for preprocedural laboratory examination: Secondary | ICD-10-CM | POA: Diagnosis not present

## 2022-09-26 DIAGNOSIS — Z01818 Encounter for other preprocedural examination: Secondary | ICD-10-CM

## 2022-09-26 HISTORY — DX: Other complications of anesthesia, initial encounter: T88.59XA

## 2022-09-26 LAB — SURGICAL PCR SCREEN
MRSA, PCR: NEGATIVE
Staphylococcus aureus: NEGATIVE

## 2022-09-26 LAB — TYPE AND SCREEN
ABO/RH(D): O POS
Antibody Screen: NEGATIVE

## 2022-09-26 NOTE — Progress Notes (Signed)
PCP - denies Cardiologist - denies  PPM/ICD - denies  Chest x-ray -  EKG - 09/16/22 Stress Test - denies ECHO - denies Cardiac Cath - denies  Sleep Study - denies CPAP - no  No diabetes.   As of today, STOP taking any Aspirin (unless otherwise instructed by your surgeon) Aleve, Naproxen, Ibuprofen, Motrin, Advil, Goody's, BC's, all herbal medications, fish oil, and all vitamins.  ERAS Protcol - no PRE-SURGERY Ensure or G2- n/a  COVID TEST- n/a  Anesthesia review: no  Patient denies shortness of breath, fever, cough and chest pain at PAT appointment  All instructions explained to the patient, with a verbal understanding of the material. Patient agrees to go over the instructions while at home for a better understanding. The opportunity to ask questions was provided.

## 2022-10-01 ENCOUNTER — Other Ambulatory Visit: Payer: Self-pay | Admitting: Neurosurgery

## 2022-10-01 ENCOUNTER — Ambulatory Visit (HOSPITAL_COMMUNITY): Payer: No Typology Code available for payment source | Admitting: Anesthesiology

## 2022-10-01 ENCOUNTER — Observation Stay (HOSPITAL_COMMUNITY)
Admission: RE | Admit: 2022-10-01 | Discharge: 2022-10-02 | Disposition: A | Discharge status: Home / Self Care | Payer: No Typology Code available for payment source | Source: Ambulatory Visit | Attending: Neurosurgery | Admitting: Neurosurgery

## 2022-10-01 ENCOUNTER — Other Ambulatory Visit: Payer: Self-pay

## 2022-10-01 ENCOUNTER — Ambulatory Visit (HOSPITAL_COMMUNITY): Payer: No Typology Code available for payment source

## 2022-10-01 ENCOUNTER — Ambulatory Visit (HOSPITAL_BASED_OUTPATIENT_CLINIC_OR_DEPARTMENT_OTHER): Payer: No Typology Code available for payment source | Admitting: Anesthesiology

## 2022-10-01 ENCOUNTER — Encounter (HOSPITAL_COMMUNITY): Payer: Self-pay

## 2022-10-01 ENCOUNTER — Encounter (HOSPITAL_COMMUNITY)
Admission: RE | Disposition: A | Discharge status: Home / Self Care | Payer: Self-pay | Source: Ambulatory Visit | Attending: Neurosurgery

## 2022-10-01 DIAGNOSIS — M4802 Spinal stenosis, cervical region: Secondary | ICD-10-CM

## 2022-10-01 DIAGNOSIS — M5001 Cervical disc disorder with myelopathy,  high cervical region: Secondary | ICD-10-CM | POA: Diagnosis not present

## 2022-10-01 DIAGNOSIS — Z87891 Personal history of nicotine dependence: Secondary | ICD-10-CM | POA: Insufficient documentation

## 2022-10-01 DIAGNOSIS — G959 Disease of spinal cord, unspecified: Secondary | ICD-10-CM | POA: Diagnosis present

## 2022-10-01 HISTORY — PX: ANTERIOR CERVICAL DECOMP/DISCECTOMY FUSION: SHX1161

## 2022-10-01 LAB — POCT PREGNANCY, URINE: Preg Test, Ur: NEGATIVE

## 2022-10-01 LAB — ABO/RH: ABO/RH(D): O POS

## 2022-10-01 SURGERY — ANTERIOR CERVICAL DECOMPRESSION/DISCECTOMY FUSION 1 LEVEL
Anesthesia: General

## 2022-10-01 MED ORDER — ACETAMINOPHEN 500 MG PO TABS: 1000.0000 mg | ORAL_TABLET | Freq: Once | ORAL | Status: DC | PRN

## 2022-10-01 MED ORDER — LIDOCAINE-EPINEPHRINE 2 %-1:100000 IJ SOLN
INTRAMUSCULAR | Status: AC
  Filled 2022-10-01: qty 1

## 2022-10-01 MED ORDER — PHENYLEPHRINE HCL-NACL 20-0.9 MG/250ML-% IV SOLN
INTRAVENOUS | Status: DC | PRN
  Administered 2022-10-01: 25 ug/min via INTRAVENOUS

## 2022-10-01 MED ORDER — SODIUM CHLORIDE 0.9% FLUSH: 3.0000 mL | Freq: Two times a day (BID) | INTRAVENOUS | Status: DC

## 2022-10-01 MED ORDER — ROCURONIUM BROMIDE 10 MG/ML (PF) SYRINGE
PREFILLED_SYRINGE | INTRAVENOUS | Status: DC | PRN
  Administered 2022-10-01: 80 mg via INTRAVENOUS
  Administered 2022-10-01: 20 mg via INTRAVENOUS

## 2022-10-01 MED ORDER — HYDROMORPHONE HCL 1 MG/ML IJ SOLN
INTRAMUSCULAR | Status: AC
  Filled 2022-10-01: qty 1

## 2022-10-01 MED ORDER — SODIUM CHLORIDE 0.9% FLUSH: 3.0000 mL | INTRAVENOUS | Status: DC | PRN

## 2022-10-01 MED ORDER — KETOROLAC TROMETHAMINE 30 MG/ML IJ SOLN
INTRAMUSCULAR | Status: DC | PRN
  Administered 2022-10-01: 30 mg via INTRAVENOUS

## 2022-10-01 MED ORDER — ONDANSETRON HCL 4 MG/2ML IJ SOLN
INTRAMUSCULAR | Status: DC | PRN
  Administered 2022-10-01: 4 mg via INTRAVENOUS

## 2022-10-01 MED ORDER — THROMBIN 5000 UNITS EX SOLR
OROMUCOSAL | Status: DC | PRN
  Administered 2022-10-01: 5 mL via TOPICAL

## 2022-10-01 MED ORDER — DOCUSATE SODIUM 100 MG PO CAPS
100.0000 mg | ORAL_CAPSULE | Freq: Two times a day (BID) | ORAL | Status: DC
  Administered 2022-10-01 – 2022-10-02 (×2): 100 mg via ORAL
  Filled 2022-10-01 (×2): qty 1

## 2022-10-01 MED ORDER — BACLOFEN 10 MG PO TABS
10.0000 mg | ORAL_TABLET | Freq: Two times a day (BID) | ORAL | Status: DC | PRN
  Administered 2022-10-01: 10 mg via ORAL
  Filled 2022-10-01: qty 1

## 2022-10-01 MED ORDER — PANTOPRAZOLE SODIUM 40 MG PO TBEC
40.0000 mg | DELAYED_RELEASE_TABLET | Freq: Every day | ORAL | Status: DC
  Administered 2022-10-02: 40 mg via ORAL
  Filled 2022-10-01: qty 1

## 2022-10-01 MED ORDER — HYDROCODONE-ACETAMINOPHEN 5-325 MG PO TABS: 1.0000 | ORAL_TABLET | Freq: Once | ORAL | Status: DC

## 2022-10-01 MED ORDER — PROPOFOL 10 MG/ML IV BOLUS
INTRAVENOUS | Status: DC | PRN
  Administered 2022-10-01: 200 mg via INTRAVENOUS

## 2022-10-01 MED ORDER — OXYCODONE HCL 5 MG PO TABS
10.0000 mg | ORAL_TABLET | ORAL | Status: DC | PRN
  Administered 2022-10-01 – 2022-10-02 (×4): 10 mg via ORAL
  Filled 2022-10-01 (×4): qty 2

## 2022-10-01 MED ORDER — ACETAMINOPHEN 325 MG PO TABS: 650.0000 mg | ORAL_TABLET | ORAL | Status: DC | PRN

## 2022-10-01 MED ORDER — ORAL CARE MOUTH RINSE: 15.0000 mL | Freq: Once | OROMUCOSAL | Status: AC

## 2022-10-01 MED ORDER — ONDANSETRON HCL 4 MG PO TABS: 4.0000 mg | ORAL_TABLET | Freq: Four times a day (QID) | ORAL | Status: DC | PRN

## 2022-10-01 MED ORDER — MENTHOL 3 MG MT LOZG: 1.0000 | LOZENGE | OROMUCOSAL | Status: DC | PRN

## 2022-10-01 MED ORDER — OXYCODONE HCL 5 MG/5ML PO SOLN: 5.0000 mg | Freq: Once | ORAL | Status: DC | PRN

## 2022-10-01 MED ORDER — GLYCOPYRROLATE 0.2 MG/ML IJ SOLN
INTRAMUSCULAR | Status: DC | PRN
  Administered 2022-10-01: .2 mg via INTRAVENOUS

## 2022-10-01 MED ORDER — LIDOCAINE-EPINEPHRINE 1 %-1:100000 IJ SOLN
INTRAMUSCULAR | Status: DC | PRN
  Administered 2022-10-01: 8 mL

## 2022-10-01 MED ORDER — CEFAZOLIN SODIUM-DEXTROSE 2-4 GM/100ML-% IV SOLN
2.0000 g | INTRAVENOUS | Status: AC
  Administered 2022-10-01: 2 g via INTRAVENOUS
  Filled 2022-10-01: qty 100

## 2022-10-01 MED ORDER — THROMBIN 5000 UNITS EX SOLR
CUTANEOUS | Status: AC
  Filled 2022-10-01: qty 5000

## 2022-10-01 MED ORDER — OXYCODONE HCL 5 MG PO TABS: 5.0000 mg | ORAL_TABLET | Freq: Once | ORAL | Status: DC | PRN

## 2022-10-01 MED ORDER — CHLORHEXIDINE GLUCONATE 0.12 % MT SOLN
15.0000 mL | Freq: Once | OROMUCOSAL | Status: AC
  Administered 2022-10-01: 15 mL via OROMUCOSAL
  Filled 2022-10-01: qty 15

## 2022-10-01 MED ORDER — POTASSIUM CHLORIDE IN NACL 20-0.9 MEQ/L-% IV SOLN: INTRAVENOUS | Status: DC

## 2022-10-01 MED ORDER — 0.9 % SODIUM CHLORIDE (POUR BTL) OPTIME
TOPICAL | Status: DC | PRN
  Administered 2022-10-01: 1000 mL

## 2022-10-01 MED ORDER — PHENOL 1.4 % MT LIQD: 1.0000 | OROMUCOSAL | Status: DC | PRN

## 2022-10-01 MED ORDER — CHLORHEXIDINE GLUCONATE CLOTH 2 % EX PADS: 6.0000 | MEDICATED_PAD | Freq: Once | CUTANEOUS | Status: DC

## 2022-10-01 MED ORDER — OXYCODONE HCL 5 MG PO TABS: 5.0000 mg | ORAL_TABLET | ORAL | Status: DC | PRN

## 2022-10-01 MED ORDER — SUGAMMADEX SODIUM 200 MG/2ML IV SOLN
INTRAVENOUS | Status: DC | PRN
  Administered 2022-10-01: 200 mg via INTRAVENOUS

## 2022-10-01 MED ORDER — DEXAMETHASONE SODIUM PHOSPHATE 10 MG/ML IJ SOLN
INTRAMUSCULAR | Status: DC | PRN
  Administered 2022-10-01: 10 mg via INTRAVENOUS

## 2022-10-01 MED ORDER — MIDAZOLAM HCL 2 MG/2ML IJ SOLN
INTRAMUSCULAR | Status: AC
  Filled 2022-10-01: qty 2

## 2022-10-01 MED ORDER — CEFAZOLIN SODIUM-DEXTROSE 1-4 GM/50ML-% IV SOLN
1.0000 g | Freq: Three times a day (TID) | INTRAVENOUS | Status: DC
  Administered 2022-10-01 – 2022-10-02 (×2): 1 g via INTRAVENOUS
  Filled 2022-10-01 (×2): qty 50

## 2022-10-01 MED ORDER — ACETAMINOPHEN 650 MG RE SUPP: 650.0000 mg | RECTAL | Status: DC | PRN

## 2022-10-01 MED ORDER — LIDOCAINE-EPINEPHRINE 1 %-1:100000 IJ SOLN
INTRAMUSCULAR | Status: AC
  Filled 2022-10-01: qty 1

## 2022-10-01 MED ORDER — HYDROMORPHONE HCL 1 MG/ML IJ SOLN
0.5000 mg | INTRAMUSCULAR | Status: DC | PRN
  Administered 2022-10-01: 0.5 mg via INTRAVENOUS
  Filled 2022-10-01: qty 0.5

## 2022-10-01 MED ORDER — ONDANSETRON HCL 4 MG/2ML IJ SOLN
4.0000 mg | Freq: Four times a day (QID) | INTRAMUSCULAR | Status: DC | PRN
  Administered 2022-10-01: 4 mg via INTRAVENOUS
  Filled 2022-10-01: qty 2

## 2022-10-01 MED ORDER — SODIUM CHLORIDE 0.9 % IV SOLN: 250.0000 mL | INTRAVENOUS | Status: DC

## 2022-10-01 MED ORDER — LACTATED RINGERS IV SOLN: INTRAVENOUS | Status: DC

## 2022-10-01 MED ORDER — LIDOCAINE 2% (20 MG/ML) 5 ML SYRINGE
INTRAMUSCULAR | Status: DC | PRN
  Administered 2022-10-01: 40 mg via INTRAVENOUS
  Administered 2022-10-01: 60 mg via INTRAVENOUS

## 2022-10-01 MED ORDER — FENTANYL CITRATE (PF) 250 MCG/5ML IJ SOLN
INTRAMUSCULAR | Status: AC
  Filled 2022-10-01: qty 5

## 2022-10-01 MED ORDER — ACETAMINOPHEN 325 MG PO TABS
650.0000 mg | ORAL_TABLET | Freq: Once | ORAL | Status: AC
  Administered 2022-10-01: 650 mg via ORAL
  Filled 2022-10-01: qty 2

## 2022-10-01 MED ORDER — FLEET ENEMA 7-19 GM/118ML RE ENEM: 1.0000 | ENEMA | Freq: Once | RECTAL | Status: DC | PRN

## 2022-10-01 MED ORDER — HYDROMORPHONE HCL 1 MG/ML IJ SOLN
0.2500 mg | INTRAMUSCULAR | Status: DC | PRN
  Administered 2022-10-01: 0.25 mg via INTRAVENOUS

## 2022-10-01 MED ORDER — ACETAMINOPHEN 160 MG/5ML PO SOLN: 1000.0000 mg | Freq: Once | ORAL | Status: DC | PRN

## 2022-10-01 MED ORDER — FENTANYL CITRATE (PF) 250 MCG/5ML IJ SOLN
INTRAMUSCULAR | Status: DC | PRN
  Administered 2022-10-01 (×2): 50 ug via INTRAVENOUS
  Administered 2022-10-01: 100 ug via INTRAVENOUS
  Administered 2022-10-01: 50 ug via INTRAVENOUS

## 2022-10-01 MED ORDER — MIDAZOLAM HCL 2 MG/2ML IJ SOLN
INTRAMUSCULAR | Status: DC | PRN
  Administered 2022-10-01: 1 mg via INTRAVENOUS

## 2022-10-01 MED ORDER — ACETAMINOPHEN 10 MG/ML IV SOLN: 1000.0000 mg | Freq: Once | INTRAVENOUS | Status: DC | PRN

## 2022-10-01 MED ORDER — POLYETHYLENE GLYCOL 3350 17 G PO PACK: 17.0000 g | PACK | Freq: Every day | ORAL | Status: DC | PRN

## 2022-10-01 SURGICAL SUPPLY — 71 items
APL SKNCLS STERI-STRIP NONHPOA (GAUZE/BANDAGES/DRESSINGS) ×1
BAG COUNTER SPONGE SURGICOUNT (BAG) ×1 IMPLANT
BAG SPNG CNTER NS LX DISP (BAG) ×1
BAND INSRT 18 STRL LF DISP RB (MISCELLANEOUS) ×2
BAND RUBBER #18 3X1/16 STRL (MISCELLANEOUS) ×2 IMPLANT
BASKET BONE COLLECTION (BASKET) IMPLANT
BENZOIN TINCTURE PRP APPL 2/3 (GAUZE/BANDAGES/DRESSINGS) ×1 IMPLANT
BIT DRILL NEURO 2X3.1 SFT TUCH (MISCELLANEOUS) ×1 IMPLANT
BLADE CLIPPER SURG (BLADE) IMPLANT
BLADE SURG 15 STRL LF DISP TIS (BLADE) IMPLANT
BLADE SURG 15 STRL SS (BLADE)
BLADE ULTRA TIP 2M (BLADE) IMPLANT
BUR MATCHSTICK NEURO 3.0 LAGG (BURR) ×1 IMPLANT
BUR SURGICAL HEAD 3X12.5 (BURR) IMPLANT
BURR SURGICAL HEAD 3X12.5 (BURR) ×1
CANISTER SUCT 3000ML PPV (MISCELLANEOUS) ×1 IMPLANT
COLLAR CERV LO CONTOUR FIRM DE (SOFTGOODS) ×1 IMPLANT
DEVICE ENDSKLTN IMPLANT SM 7MM (Cage) IMPLANT
DRAPE C-ARM 42X72 X-RAY (DRAPES) ×2 IMPLANT
DRAPE LAPAROTOMY 100X72 PEDS (DRAPES) ×1 IMPLANT
DRAPE MICROSCOPE SLANT 54X150 (MISCELLANEOUS) ×1 IMPLANT
DRAPE SHEET LG 3/4 BI-LAMINATE (DRAPES) ×1 IMPLANT
DRESSING MEPILEX FLEX 4X4 (GAUZE/BANDAGES/DRESSINGS) IMPLANT
DRILL NEURO 2X3.1 SOFT TOUCH (MISCELLANEOUS) ×1
DRSG MEPILEX FLEX 4X4 (GAUZE/BANDAGES/DRESSINGS)
DRSG OPSITE 4X5.5 SM (GAUZE/BANDAGES/DRESSINGS) ×2 IMPLANT
DRSG OPSITE POSTOP 3X4 (GAUZE/BANDAGES/DRESSINGS) ×1 IMPLANT
DURAPREP 26ML APPLICATOR (WOUND CARE) ×1 IMPLANT
ELECT COATED BLADE 2.86 ST (ELECTRODE) ×1 IMPLANT
ELECT REM PT RETURN 9FT ADLT (ELECTROSURGICAL) ×1
ELECTRODE REM PT RTRN 9FT ADLT (ELECTROSURGICAL) ×1 IMPLANT
ENDOSKELETON IMPLANT SM 7MM (Cage) ×1 IMPLANT
GAUZE 4X4 16PLY ~~LOC~~+RFID DBL (SPONGE) IMPLANT
GLOVE BIOGEL PI IND STRL 7.5 (GLOVE) ×1 IMPLANT
GLOVE ECLIPSE 7.5 STRL STRAW (GLOVE) ×1 IMPLANT
GLOVE SURG ENC MOIS LTX SZ8 (GLOVE) ×1 IMPLANT
GLOVE SURG UNDER POLY LF SZ8.5 (GLOVE) ×1 IMPLANT
GOWN STRL REUS W/ TWL LRG LVL3 (GOWN DISPOSABLE) ×2 IMPLANT
GOWN STRL REUS W/ TWL XL LVL3 (GOWN DISPOSABLE) ×2 IMPLANT
GOWN STRL REUS W/TWL 2XL LVL3 (GOWN DISPOSABLE) IMPLANT
GOWN STRL REUS W/TWL LRG LVL3 (GOWN DISPOSABLE) ×2
GOWN STRL REUS W/TWL XL LVL3 (GOWN DISPOSABLE) ×2
HEMOSTAT POWDER KIT SURGIFOAM (HEMOSTASIS) ×1 IMPLANT
KIT BASIN OR (CUSTOM PROCEDURE TRAY) ×1 IMPLANT
KIT TURNOVER KIT B (KITS) ×1 IMPLANT
NDL SPNL 22GX3.5 QUINCKE BK (NEEDLE) ×1 IMPLANT
NEEDLE HYPO 22GX1.5 SAFETY (NEEDLE) ×1 IMPLANT
NEEDLE SPNL 22GX3.5 QUINCKE BK (NEEDLE) ×1 IMPLANT
NS IRRIG 1000ML POUR BTL (IV SOLUTION) ×1 IMPLANT
PACK LAMINECTOMY NEURO (CUSTOM PROCEDURE TRAY) ×1 IMPLANT
PAD ARMBOARD 7.5X6 YLW CONV (MISCELLANEOUS) ×3 IMPLANT
PATTIES SURGICAL .5 X3 (DISPOSABLE) ×1 IMPLANT
PIN DISTRACTION 14MM (PIN) ×2 IMPLANT
PLATE ZEVO 1LVL 17MM (Plate) IMPLANT
PUTTY DBF 1CC CORTICAL FIBERS (Putty) IMPLANT
SCREW 3.5 SELFDRILL 15MM VARI (Screw) IMPLANT
SPIKE FLUID TRANSFER (MISCELLANEOUS) ×1 IMPLANT
SPONGE INTESTINAL PEANUT (DISPOSABLE) ×1 IMPLANT
SPONGE SURGIFOAM ABS GEL SZ50 (HEMOSTASIS) IMPLANT
STAPLER VISISTAT 35W (STAPLE) IMPLANT
STRIP CLOSURE SKIN 1/2X4 (GAUZE/BANDAGES/DRESSINGS) ×1 IMPLANT
SUT MNCRL AB 4-0 PS2 18 (SUTURE) ×1 IMPLANT
SUT SILK 2 0 TIES 10X30 (SUTURE) IMPLANT
SUT VIC AB 0 CT1 27 (SUTURE)
SUT VIC AB 0 CT1 27XBRD ANTBC (SUTURE) IMPLANT
SUT VIC AB 2-0 CP2 18 (SUTURE) IMPLANT
SUT VIC AB 3-0 SH 8-18 (SUTURE) ×1 IMPLANT
TAPE CLOTH 3X10 TAN LF (GAUZE/BANDAGES/DRESSINGS) ×1 IMPLANT
TOWEL GREEN STERILE (TOWEL DISPOSABLE) ×1 IMPLANT
TOWEL GREEN STERILE FF (TOWEL DISPOSABLE) ×1 IMPLANT
WATER STERILE IRR 1000ML POUR (IV SOLUTION) ×1 IMPLANT

## 2022-10-01 NOTE — Anesthesia Preprocedure Evaluation (Signed)
Anesthesia Evaluation  Patient identified by MRN, date of birth, ID band Patient awake    Reviewed: Allergy & Precautions, NPO status , Patient's Chart, lab work & pertinent test results  History of Anesthesia Complications Negative for: history of anesthetic complications  Airway Mallampati: III  TM Distance: >3 FB Neck ROM: Limited    Dental  (+) Teeth Intact, Dental Advisory Given,    Pulmonary neg shortness of breath, neg sleep apnea, neg COPD, neg recent URI, former smoker   breath sounds clear to auscultation       Cardiovascular negative cardio ROS  Rhythm:Regular     Neuro/Psych  Neuromuscular disease  negative psych ROS   GI/Hepatic Neg liver ROS,GERD  Controlled,,  Endo/Other  negative endocrine ROS    Renal/GU negative Renal ROS     Musculoskeletal negative musculoskeletal ROS (+)    Abdominal   Peds  Hematology negative hematology ROS (+)   Anesthesia Other Findings   Reproductive/Obstetrics                              Anesthesia Physical Anesthesia Plan  ASA: 2  Anesthesia Plan: General   Post-op Pain Management: Tylenol PO (pre-op)* and Toradol IV (intra-op)*   Induction: Intravenous  PONV Risk Score and Plan: 3 and Ondansetron and Dexamethasone  Airway Management Planned: Oral ETT  Additional Equipment: None  Intra-op Plan:   Post-operative Plan: Extubation in OR  Informed Consent: I have reviewed the patients History and Physical, chart, labs and discussed the procedure including the risks, benefits and alternatives for the proposed anesthesia with the patient or authorized representative who has indicated his/her understanding and acceptance.     Dental advisory given  Plan Discussed with: CRNA  Anesthesia Plan Comments:          Anesthesia Quick Evaluation

## 2022-10-01 NOTE — H&P (Signed)
CC: weakness  HPI:     Patient is a 46 y.o. 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      Patient Active Problem List   Diagnosis Date Noted   UTI (urinary tract infection) 08/08/2013   Trichomonal infection 08/08/2013   Bacterial vaginosis 04/26/2013   GERD (gastroesophageal reflux disease) 04/25/2013   Tinea pedis 04/25/2013   Vaginal odor 04/25/2013   History of abnormal Pap smear 04/25/2013   Past Medical History:  Diagnosis Date   Complication of anesthesia    slow to wake after tubal ligation   GERD (gastroesophageal reflux disease)     Past Surgical History:  Procedure Laterality Date   TUBAL LIGATION      Medications Prior to Admission  Medication Sig Dispense Refill Last Dose   APPLE CIDER VINEGAR PO Take 2 tablets by mouth daily.   Past Week   baclofen (LIORESAL) 10 MG tablet Take 1 tablet (10 mg total) by mouth 2 (two) times daily as needed for muscle spasms. 30 each 0 Past Week   omeprazole (PRILOSEC) 20 MG capsule Take 1 capsule (20 mg total) by mouth daily. 45 capsule 0 Past Week   oxyCODONE-acetaminophen (PERCOCET) 5-325 MG tablet Take 1 tablet by mouth every 4 (four) hours as needed. 20 tablet 0 10/01/2022 at 0930   predniSONE (STERAPRED UNI-PAK 21 TAB) 10 MG (21) TBPK tablet As directed 21 tablet 0 Past Month   triamcinolone cream (KENALOG) 0.1 % Apply 1 Application topically 2 (two) times daily. (Patient taking differently: Apply 1 Application topically daily.) 30 g 0 Past Month   methylPREDNISolone (MEDROL DOSEPAK) 4 MG TBPK tablet Follow package directions (Patient not taking: Reported on 09/23/2022) 21 tablet 0 More than a month   No Known Allergies  Social History   Tobacco Use   Smoking status: Former    Types: Cigarettes    Start date: 03/26/2013   Smokeless tobacco: Never  Substance Use Topics   Alcohol use: Yes    Comment: occasionally    Family  History  Problem Relation Age of Onset   Cancer Maternal Grandmother        ?type   Diabetes Maternal Grandmother    Heart failure Maternal Grandmother    Cancer Maternal Grandfather        ?type     Review of Systems Pertinent items noted in HPI and remainder of comprehensive ROS otherwise negative.  Objective:   Patient Vitals for the past 8 hrs:  BP Temp Temp src Pulse Resp SpO2 Height Weight  10/01/22 1057 129/74 98.8 F (37.1 C) Oral 60 18 98 % 5\' 4"  (1.626 m) 97.1 kg   No intake/output data recorded. No intake/output data recorded. NAD Breathing comfortably Decreased pain/tempo sensation on right, 4/5 L Wext, 4+/5 HG bilaterally 3+ reflexes    Data ReviewCBC:  Lab Results  Component Value Date   WBC 9.5 09/16/2022   RBC 4.09 09/16/2022   BMP:  Lab Results  Component Value Date   GLUCOSE 101 (H) 09/16/2022   CO2 22 09/16/2022   BUN 10 09/16/2022   CREATININE 0.76 09/16/2022   CREATININE 0.83 04/25/2013   CALCIUM 8.6 (L) 09/16/2022   Radiology review:  See clinic note for details  Assessment:   Active Problems:   * No active hospital problems. 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

## 2022-10-01 NOTE — Anesthesia Procedure Notes (Signed)
Procedure Name: Intubation Date/Time: 10/01/2022 1:37 PM  Performed by: Mariea Clonts, CRNAPre-anesthesia Checklist: Patient identified, Emergency Drugs available, Suction available and Patient being monitored Patient Re-evaluated:Patient Re-evaluated prior to induction Oxygen Delivery Method: Circle System Utilized Preoxygenation: Pre-oxygenation with 100% oxygen Induction Type: IV induction Ventilation: Mask ventilation without difficulty Laryngoscope Size: Miller and 2 Grade View: Grade II Tube type: Oral Tube size: 7.5 mm Number of attempts: 1 Airway Equipment and Method: Stylet and Oral airway Placement Confirmation: ETT inserted through vocal cords under direct vision, positive ETCO2 and breath sounds checked- equal and bilateral Tube secured with: Tape Dental Injury: Teeth and Oropharynx as per pre-operative assessment

## 2022-10-01 NOTE — Op Note (Signed)
PREOP DIAGNOSIS: Cervical stenosis with myelopathy  POSTOP DIAGNOSIS: Cervical stenosis with myelopathy  PROCEDURE: 1. Arthrodesis C3-4, anterior interbody technique, including discectomy for decompression of spinal cord and exiting nerve roots with foraminotomies  2. Placement of intervertebral biomechanical device C3-4 3. Placement of anterior instrumentation consisting of interbody plate and screws C3-4 4. Use of morselized bone allograft  5. Use of intraoperative microscope  SURGEON: Dr. Hoyt Koch, MD  ASSISTANT: Docia Barrier, NP.  Please note, no qualified trainees were available to assist with the procedure.  Assistance was required for retraction of the visceral structures to safely allow for instrumentation.  ANESTHESIA: General Endotracheal  EBL: 25 ml  IMPLANTS: Medtronic 7 mm Titan-C cage 17 mm Zevo plate 15 mm screws x 4 DBF  SPECIMENS: None  DRAINS: None  COMPLICATIONS: None immediate  CONDITION: Hemodynamically stable to PACU  HISTORY: Nicole House is a 46 y.o. y.o. 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  She was found to have C3-4 disc rupture and herniation with cord compression/cord signal change.  Risks, benefits, alternatives, and expected convalescence were discussed with the patient.  Risks discussed included but were not limited to bleeding, pain, infection, dysphagia, dysphonia, pseudoarthrosis, hardware failure, adjacent segment disease, CSF leak, neurologic deficits, weakness, numbness, paralysis, coma, and death. After all questions were answered, informed consent was obtained.  PROCEDURE IN DETAIL: The patient was brought to the operating room and transferred to the operative table. After induction of general anesthesia, the patient was positioned on the operative table in the supine position with all pressure points meticulously padded. The skin of the  neck was then prepped and draped in the usual sterile fashion.  After timeout was conducted, the skin was infiltrated with local anesthetic. Skin incision was then made sharply and Bovie electrocautery was used to dissect the subcutaneous tissue until the platysma was identified. The platysma was then divided and undermined. The sternocleidomastoid muscle was then identified and, utilizing natural fascial planes in the neck, the prevertebral fascia was identified and the carotid sheath was retracted laterally and the trachea and esophagus retracted medially. Again using fluoroscopy, the C3-4 disc space was identified. Bovie electrocautery was used to dissect in the subperiosteal plane and elevate the bilateral longus coli muscles. Self-retaining retractors were then placed. Caspar distraction pins were placed in the adjacent bodies to allow for gentle distraction.  At this point, the microscope was draped and brought into the field, and the remainder of the case was done under the microscope using microdissecting technique.  The disc space was incised sharply and combination of high speed drill, curettes, and rongeurs were use to initially complete a discectomy. The high-speed drill was then used to complete discectomy until the posterior annulus was identified and removed and the posterior longitudinal ligament was identified. Using a nerve hook, the PLL was elevated, and Kerrison rongeurs were used to remove the posterior longitudinal ligament and the ventral thecal sac was identified. Subligamentous disc herniation fragment compressing the left hemicord was removed.  Additional undercutting was performed of the posterior C3 body to ensure good decompression of the spinal cord.  Using a combination of curettes and rongeurs, complete decompression of the thecal sac and exiting nerve roots at this level was completed, and verified with easy passage of micro-nerve hook centrally and in the bilateral  foramina.  Having completed our decompression, attention was turned to placement of the intervertebral device. Trial spacers were used to select a  size 7 mm graft. This graft was then filled with morcellized allograft, and inserted under live fluoroscopy.  After placement of the intervertebral devices, the caspar pins were removed.  An anterior cervical plate was placed across the interspaces for anterior fixation.  Using a high-speed drill, the cortex of the cervical vertebral bodies was punctured, and screws inserted in the vertebral bodies. Final fluoroscopic images in AP and lateral projections were taken to confirm good hardware placement.  At this point, after all counts were verified to be correct, meticulous hemostasis was secured using a combination of bipolar electrocautery and passive hemostatics. The platysma muscle was then closed using interrupted 3-0 Vicryl sutures, and the skin was closed with a 4-0 monocryl in subcuticular fashion. Sterile dressings were then applied and the drapes removed. The patient tolerated the procedure well and was extubated in the room and taken to the postanesthesia care unit in stable condition.  All counts were correct at the end of the procedure.

## 2022-10-01 NOTE — Transfer of Care (Signed)
Immediate Anesthesia Transfer of Care Note  Patient: Nicole House  Procedure(s) Performed: Anterior Cervical Discectomy Fusion Cervical Three-Four  Patient Location: PACU  Anesthesia Type:General  Level of Consciousness: drowsy  Airway & Oxygen Therapy: Patient Spontanous Breathing and Patient connected to nasal cannula oxygen  Post-op Assessment: Report given to RN  Post vital signs: Reviewed and stable  Last Vitals:  Vitals Value Taken Time  BP 151/95 10/01/22 1553  Temp    Pulse 84 10/01/22 1555  Resp 14 10/01/22 1555  SpO2 100 % 10/01/22 1555  Vitals shown include unvalidated device data.  Last Pain:  Vitals:   10/01/22 1115  TempSrc:   PainSc: 9       Patients Stated Pain Goal: 4 (93/90/30 0923)  Complications: No notable events documented.

## 2022-10-01 NOTE — Progress Notes (Signed)
Orthopedic Tech Progress Note Patient Details:  Nicole House 1976-01-30 [id number]  RN stated patient has a SOFT COLLAR    Patient ID: Nicole House, female   DOB: 10-28-1976, 46 y.o.   MRN: [id number]  Nicole House 10/01/2022, 6:34 PM

## 2022-10-02 ENCOUNTER — Encounter (HOSPITAL_COMMUNITY): Payer: Self-pay | Admitting: Neurosurgery

## 2022-10-02 DIAGNOSIS — M4802 Spinal stenosis, cervical region: Secondary | ICD-10-CM | POA: Diagnosis not present

## 2022-10-02 MED ORDER — OXYCODONE-ACETAMINOPHEN 5-325 MG PO TABS: 1.0000 | ORAL_TABLET | ORAL | 0 refills | Status: AC | PRN

## 2022-10-02 MED ORDER — BACLOFEN 10 MG PO TABS: 10.0000 mg | ORAL_TABLET | Freq: Three times a day (TID) | ORAL | 1 refills | Status: AC | PRN

## 2022-10-02 NOTE — Plan of Care (Signed)
Pt doing well. Pt and husband given D/C instructions with verbal understanding. Rx's were sent to the pharmacy by MD. Pt's incision is clean and dry with no sign of infection. Pt is voiding and ambulating. Pt received RW from [employer] per MD order. Pt D/C'd home via wheelchair per MD order. Pt is stable @ D/C and has no other needs at this time. Holli Humbles, RN

## 2022-10-02 NOTE — Anesthesia Postprocedure Evaluation (Signed)
Anesthesia Post Note  Patient: Nicole House  Procedure(s) Performed: Anterior Cervical Discectomy Fusion Cervical Three-Four     Patient location during evaluation: PACU Anesthesia Type: General Level of consciousness: awake and alert Pain management: pain level controlled Vital Signs Assessment: post-procedure vital signs reviewed and stable Respiratory status: spontaneous breathing, nonlabored ventilation, respiratory function stable and patient connected to nasal cannula oxygen Cardiovascular status: blood pressure returned to baseline and stable Postop Assessment: no apparent nausea or vomiting Anesthetic complications: no   No notable events documented.  Last Vitals:  Vitals:   10/02/22 0431 10/02/22 0736  BP: 128/70 112/70  Pulse: 69 74  Resp: 16 16  Temp: 36.6 C 36.8 C  SpO2: 98% 99%    Last Pain:  Vitals:   10/02/22 0736  TempSrc: Oral  PainSc:                  Virl Coble

## 2022-10-02 NOTE — Progress Notes (Signed)
PT Cancellation Note  Patient Details Name: Nicole House MRN: [id number] DOB: 09-Jan-1976   Cancelled Treatment:    Reason Eval/Treat Not Completed: PT screened, no needs identified, will sign off    Hampton Aynsley Fleet 10/02/2022, 10:42 AM

## 2022-10-02 NOTE — Evaluation (Signed)
Occupational Therapy Evaluation Patient Details Name: Nicole House MRN: [id number] DOB: 1976-07-25 Today's Date: 10/02/2022   History of Present Illness 46 yo F s/p ACDF.  PMH includes: GERD, UTI.   Clinical Impression   Patient admitted for the diagnosis and procedure above.  PTA she lives at home with her spouse.  The patient will have adequate assist at home.  Continued right leg weakness and neck pain are the deficits.  She is very close to her baseline for mobility and ADL completion.  The patient has a good understanding of all precautions, all questions answered, and no further acute need exist.  Recommend follow up as prescribed by MD.        Recommendations for follow up therapy are one component of a multi-disciplinary discharge planning process, led by the attending physician.  Recommendations may be updated based on patient status, additional functional criteria and insurance authorization.   Follow Up Recommendations  No OT follow up    Assistance Recommended at Discharge Set up Supervision/Assistance  Patient can return home with the following Assist for transportation;Assistance with cooking/housework    Functional Status Assessment  Patient has had a recent decline in their functional status and demonstrates the ability to make significant improvements in function in a reasonable and predictable amount of time.  Equipment Recommendations   (Rolling walker)    Recommendations for Other Services       Precautions / Restrictions Precautions Precautions: Cervical Precaution Booklet Issued: Yes (comment) Required Braces or Orthoses: Cervical Brace Cervical Brace: Soft collar;For comfort Restrictions Weight Bearing Restrictions: No      Mobility Bed Mobility Overal bed mobility: Modified Independent                  Transfers Overall transfer level: Modified independent Equipment used: Rolling walker (2 wheels)                       Balance Overall balance assessment: Needs assistance Sitting-balance support: Feet supported Sitting balance-Leahy Scale: Good     Standing balance support: Reliant on assistive device for balance Standing balance-Leahy Scale: Poor                             ADL either performed or assessed with clinical judgement   ADL Overall ADL's : At baseline                                             Vision Baseline Vision/History: 0 No visual deficits Patient Visual Report: No change from baseline       Perception Perception Perception: Not tested   Praxis Praxis Praxis: Not tested    Pertinent Vitals/Pain Pain Assessment Pain Assessment: Faces Faces Pain Scale: Hurts even more Pain Location: neck Pain Descriptors / Indicators: Aching, Tender, Grimacing Pain Intervention(s): Monitored during session     Hand Dominance Right   Extremity/Trunk Assessment Upper Extremity Assessment Upper Extremity Assessment: Overall WFL for tasks assessed   Lower Extremity Assessment Lower Extremity Assessment: RLE deficits/detail RLE Deficits / Details: baseline weakness RLE Sensation: decreased light touch RLE Coordination: WNL   Cervical / Trunk Assessment Cervical / Trunk Assessment: Neck Surgery   Communication Communication Communication: No difficulties   Cognition Arousal/Alertness: Awake/alert Behavior During Therapy: WFL for tasks assessed/performed Overall Cognitive Status: Within Functional Limits  for tasks assessed                                       General Comments   VSS on RA    Exercises     Shoulder Instructions      Home Living Family/patient expects to be discharged to:: Private residence Living Arrangements: Spouse/significant other Available Help at Discharge: Family;Available 24 hours/day Type of Home: House Home Access: Level entry     Home Layout: One level     Bathroom Shower/Tub: Arts administrator: Standard Bathroom Accessibility: Yes How Accessible: Accessible via walker Home Equipment: [place] - single point          Prior Functioning/Environment Prior Level of Function : Independent/Modified Independent;Driving                        OT Problem List: Impaired balance (sitting and/or standing);Pain      OT Treatment/Interventions:      OT Goals(Current goals can be found in the care plan section) Acute Rehab OT Goals Patient Stated Goal: Return home OT Goal Formulation: With patient Time For Goal Achievement: 10/06/22 Potential to Achieve Goals: Good  OT Frequency:      Co-evaluation              AM-PAC OT "6 Clicks" Daily Activity     Outcome Measure Help from another person eating meals?: None Help from another person taking care of personal grooming?: None Help from another person toileting, which includes using toliet, bedpan, or urinal?: None Help from another person bathing (including washing, rinsing, drying)?: None Help from another person to put on and taking off regular upper body clothing?: None Help from another person to put on and taking off regular lower body clothing?: None 6 Click Score: 24   End of Session Equipment Utilized During Treatment: Rolling walker (2 wheels) Nurse Communication: Mobility status  Activity Tolerance: Patient tolerated treatment well Patient left: in bed;with call bell/phone within reach;with family/visitor present  OT Visit Diagnosis: Unsteadiness on feet (R26.81)                Time: 6606-3016 OT Time Calculation (min): 19 min Charges:  OT General Charges $OT Visit: 1 Visit OT Evaluation $OT Eval Moderate Complexity: 1 Mod  10/02/2022  RP, OTR/L  Acute Rehabilitation Services  Office:  [phone]   Metta Clines 10/02/2022, 9:31 AM

## 2022-10-02 NOTE — Discharge Summary (Signed)
Physician Discharge Summary  Patient ID: Nicole House MRN: [id number] DOB/AGE: 45-Jan-1977 45 y.o.  Admit date: 10/01/2022 Discharge date: 10/02/2022  Admission Diagnoses: Cervical stenosis with myelopathy  Discharge Diagnoses: Cervical stenosis with myelopathy Principal Problem:   Cervical myelopathy [facility])   Discharged Condition: good  Hospital Course: The patient was admitted on 10/01/2022 and taken to the operating room where the patient underwent C3-4 ACDF. The patient tolerated the procedure well and was taken to the recovery room and then to the floor in stable condition. The hospital course was routine. There were no complications. The wound remained clean dry and intact. Pt had appropriate back soreness. No complaints of leg pain or new N/T/W. The patient remained afebrile with stable vital signs, and tolerated a regular diet. The patient continued to increase activities, and pain was well controlled with oral pain medications.   Consults: None  Significant Diagnostic Studies: radiology: X-Ray: intraoperative   Treatments: surgery:  1. Arthrodesis C3-4, anterior interbody technique, including discectomy for decompression of spinal cord and exiting nerve roots with foraminotomies  2. Placement of intervertebral biomechanical device C3-4 3. Placement of anterior instrumentation consisting of interbody plate and screws W0-9 4. Use of morselized bone allograft  5. Use of intraoperative microscope   Discharge Exam: Blood pressure 112/70, pulse 74, temperature 98.3 F (36.8 C), temperature source Oral, resp. rate 16, height 5\' 4"  (1.626 m), weight 97.1 kg, last menstrual period 08/30/2022, SpO2 99 %. Physical Exam: Patient is awake, A/O X 4, conversant, and in good spirits. Eyes open spontaneously. They are in NAD and VSS. Doing well. Speech is fluent and appropriate. Mild dysphonia. MAEW. Sensation to light touch is decreased in RUE and RLE. PERLA, EOMI. CNs grossly intact.  Dressing is clean dry intact. Incision is well approximated with no drainage, erythema, or edema. Soft cervical collar in place    Disposition: Discharge disposition: 01-Home or Self Care       Discharge Instructions     Incentive spirometry RT   Complete by: As directed       Allergies as of 10/02/2022   No Known Allergies      Medication List     TAKE these medications    APPLE CIDER VINEGAR PO Take 2 tablets by mouth daily.   baclofen 10 MG tablet Commonly known as: LIORESAL Take 1 tablet (10 mg total) by mouth 2 (two) times daily as needed for muscle spasms. What changed: Another medication with the same name was added. Make sure you understand how and when to take each.   baclofen 10 MG tablet Commonly known as: LIORESAL Take 1 tablet (10 mg total) by mouth 3 (three) times daily as needed for muscle spasms. What changed: You were already taking a medication with the same name, and this prescription was added. Make sure you understand how and when to take each.   methylPREDNISolone 4 MG Tbpk tablet Commonly known as: MEDROL DOSEPAK Follow package directions   omeprazole 20 MG capsule Commonly known as: PRILOSEC Take 1 capsule (20 mg total) by mouth daily.   oxyCODONE-acetaminophen 5-325 MG tablet Commonly known as: Percocet Take 1 tablet by mouth every 4 (four) hours as needed. What changed: Another medication with the same name was added. Make sure you understand how and when to take each.   oxyCODONE-acetaminophen 5-325 MG tablet Commonly known as: Percocet Take 1-2 tablets by mouth every 4 (four) hours as needed for severe pain. What changed: You were already taking a medication with the  same name, and this prescription was added. Make sure you understand how and when to take each.   predniSONE 10 MG (21) Tbpk tablet Commonly known as: STERAPRED UNI-PAK 21 TAB As directed   triamcinolone cream 0.1 % Commonly known as: KENALOG Apply 1 Application  topically 2 (two) times daily. What changed: when to take this         Signed: Council Mechanic 10/02/2022, 9:47 AM

## 2024-05-06 ENCOUNTER — Emergency Department (HOSPITAL_BASED_OUTPATIENT_CLINIC_OR_DEPARTMENT_OTHER)

## 2024-05-06 ENCOUNTER — Other Ambulatory Visit: Payer: Self-pay

## 2024-05-06 ENCOUNTER — Emergency Department (HOSPITAL_BASED_OUTPATIENT_CLINIC_OR_DEPARTMENT_OTHER)
Admission: EM | Admit: 2024-05-06 | Discharge: 2024-05-06 | Disposition: A | Discharge status: Home / Self Care | Attending: Emergency Medicine | Admitting: Emergency Medicine

## 2024-05-06 ENCOUNTER — Encounter (HOSPITAL_BASED_OUTPATIENT_CLINIC_OR_DEPARTMENT_OTHER): Payer: Self-pay | Admitting: *Deleted

## 2024-05-06 DIAGNOSIS — S60222A Contusion of left hand, initial encounter: Secondary | ICD-10-CM | POA: Insufficient documentation

## 2024-05-06 DIAGNOSIS — W01198A Fall on same level from slipping, tripping and stumbling with subsequent striking against other object, initial encounter: Secondary | ICD-10-CM | POA: Diagnosis not present

## 2024-05-06 DIAGNOSIS — R55 Syncope and collapse: Secondary | ICD-10-CM | POA: Diagnosis not present

## 2024-05-06 DIAGNOSIS — S0993XA Unspecified injury of face, initial encounter: Secondary | ICD-10-CM | POA: Insufficient documentation

## 2024-05-06 DIAGNOSIS — S6992XA Unspecified injury of left wrist, hand and finger(s), initial encounter: Secondary | ICD-10-CM | POA: Diagnosis present

## 2024-05-06 LAB — COMPREHENSIVE METABOLIC PANEL WITH GFR
ALT: 11 U/L (ref 0–44)
AST: 15 U/L (ref 15–41)
Albumin: 4.5 g/dL (ref 3.5–5.0)
Alkaline Phosphatase: 69 U/L (ref 38–126)
Anion gap: 13 (ref 5–15)
BUN: 7 mg/dL (ref 6–20)
CO2: 22 mmol/L (ref 22–32)
Calcium: 9 mg/dL (ref 8.9–10.3)
Chloride: 103 mmol/L (ref 98–111)
Creatinine, Ser: 0.72 mg/dL (ref 0.44–1.00)
GFR, Estimated: >60 mL/min (ref >60)
Glucose, Bld: 92 mg/dL (ref 70–99)
Potassium: 4.2 mmol/L (ref 3.5–5.1)
Sodium: 138 mmol/L (ref 135–145)
Total Bilirubin: 0.4 mg/dL (ref 0.0–1.2)
Total Protein: 7.3 g/dL (ref 6.5–8.1)

## 2024-05-06 LAB — CBC WITH DIFFERENTIAL/PLATELET
Abs Immature Granulocytes: 0.01 10*3/uL (ref 0.00–0.07)
Basophils Absolute: 0 10*3/uL (ref 0.0–0.1)
Basophils Relative: 0 %
Eosinophils Absolute: 0.3 10*3/uL (ref 0.0–0.5)
Eosinophils Relative: 4 %
HCT: 35.6 % — ABNORMAL LOW (ref 36.0–46.0)
Hemoglobin: 11.8 g/dL — ABNORMAL LOW (ref 12.0–15.0)
Immature Granulocytes: 0 %
Lymphocytes Relative: 32 %
Lymphs Abs: 2.6 10*3/uL (ref 0.7–4.0)
MCH: 28.1 pg (ref 26.0–34.0)
MCHC: 33.1 g/dL (ref 30.0–36.0)
MCV: 84.8 fL (ref 80.0–100.0)
Monocytes Absolute: 0.5 10*3/uL (ref 0.1–1.0)
Monocytes Relative: 6 %
Neutro Abs: 4.6 10*3/uL (ref 1.7–7.7)
Neutrophils Relative %: 58 %
Platelets: 297 10*3/uL (ref 150–400)
RBC: 4.2 MIL/uL (ref 3.87–5.11)
RDW: 14.8 % (ref 11.5–15.5)
WBC: 8 10*3/uL (ref 4.0–10.5)
nRBC: 0 % (ref 0.0–0.2)

## 2024-05-06 LAB — URINALYSIS, ROUTINE W REFLEX MICROSCOPIC
Bilirubin Urine: NEGATIVE
Glucose, UA: NEGATIVE mg/dL
Hgb urine dipstick: NEGATIVE
Ketones, ur: NEGATIVE mg/dL
Leukocytes,Ua: NEGATIVE
Nitrite: NEGATIVE
Protein, ur: NEGATIVE mg/dL
Specific Gravity, Urine: 1.01 (ref 1.005–1.030)
pH: 5.5 (ref 5.0–8.0)

## 2024-05-06 LAB — TROPONIN T, HIGH SENSITIVITY: Troponin T High Sensitivity: <15 ng/L (ref <19)

## 2024-05-06 LAB — PREGNANCY, URINE: Preg Test, Ur: NEGATIVE

## 2024-05-06 MED ORDER — OXYCODONE-ACETAMINOPHEN 5-325 MG PO TABS
1.0000 | ORAL_TABLET | Freq: Once | ORAL | Status: AC
  Administered 2024-05-06: 1 via ORAL
  Filled 2024-05-06: qty 1

## 2024-05-06 MED ORDER — HYDROCODONE-ACETAMINOPHEN 5-325 MG PO TABS
1.0000 | ORAL_TABLET | Freq: Four times a day (QID) | ORAL | 0 refills | Status: AC | PRN
Start: 2024-05-06 — End: 2024-05-11

## 2024-05-06 NOTE — Discharge Instructions (Addendum)
 Ice to areas of pain.  Return if any problems.  See your Physician for recheck on next week

## 2024-05-06 NOTE — ED Provider Notes (Signed)
 [place] [facility] AT [facility] Provider Note   CSN: [id number] Arrival date & time: 05/06/24  1245     History  Chief Complaint  Patient presents with   Loss of Consciousness    Nicole House is a 48 y.o. female.  Patient reports last night she passed out and struck her face.  Patient reports that she was drinking alcohol.  She went to the refrigerator and bent over and passed out.  Patient has a broken tooth.  Patient reports that she has soreness in her left hand and soreness in her mouth from broken tooth.  Patient reports episode happened last night at approximately 10 PM.  Patient complains of soreness today.  She tells me that she has had a history of neck surgery and that she has to move her neck slowly and be careful with standing up quickly or she can experience a dizziness.  Patient denies any visual changes she has not had any hearing changes.  She is sore on the left side where she hit.  She denies any weakness in any area patient has not had any fever or chills she denies any chest pain she has not had any headache she was feeling well before the episode  The history is provided by the patient. No language interpreter was used.  Loss of Consciousness      Home Medications Prior to Admission medications   Medication Sig Start Date End Date Taking? Authorizing Provider  HYDROcodone -acetaminophen  (NORCO/VICODIN) 5-325 MG tablet Take 1 tablet by mouth every 6 (six) hours as needed for up to 5 days for moderate pain (pain score 4-6). 05/06/24 05/11/24 Yes Asaph Serena K, PA-C  APPLE CIDER VINEGAR PO Take 2 tablets by mouth daily.    [provider]  baclofen  (LIORESAL ) 10 MG tablet Take 1 tablet (10 mg total) by mouth 2 (two) times daily as needed for muscle spasms. 09/04/22   Raspet, Betsey Brow, PA-C  methylPREDNISolone  (MEDROL  DOSEPAK) 4 MG TBPK tablet Follow package directions Patient not taking: Reported on 09/23/2022 09/17/22   Coretha Dew, PA-C  omeprazole  (PRILOSEC) 20 MG capsule Take 1 capsule (20 mg total) by mouth daily. 04/25/13   McLean-Scocuzza, Karon Packer, MD  oxyCODONE -acetaminophen  (PERCOCET) 5-325 MG tablet Take 1 tablet by mouth every 4 (four) hours as needed. 09/17/22   Coretha Dew, PA-C  predniSONE  (STERAPRED UNI-PAK 21 TAB) 10 MG (21) TBPK tablet As directed 09/04/22   Raspet, Erin K, PA-C  triamcinolone  cream (KENALOG ) 0.1 % Apply 1 Application topically 2 (two) times daily. Patient taking differently: Apply 1 Application topically daily. 09/04/22   Raspet, Erin K, PA-C      Allergies    Patient has no known allergies.    Review of Systems   Review of Systems  Cardiovascular:  Positive for syncope.  All other systems reviewed and are negative.   Physical Exam Updated Vital Signs BP 115/68   Pulse 62   Temp 98.1 F (36.7 C) (Oral)   Resp 15   Ht 5\' 4"  (1.626 m)   Wt 97.1 kg   LMP 04/20/2024 (Approximate)   SpO2 100%   BMI 36.73 kg/m  Physical Exam Vitals and nursing note reviewed.  Constitutional:      Appearance: She is well-developed.  HENT:     Head: Normocephalic.     Right Ear: External ear normal.     Left Ear: External ear normal.     Nose: Nose normal.  Mouth/Throat:     Mouth: Mucous membranes are moist.     Comments: Broken upper left lateral incisor.  Eyes:     Extraocular Movements: Extraocular movements intact.     Conjunctiva/sclera: Conjunctivae normal.     Pupils: Pupils are equal, round, and reactive to light.  Cardiovascular:     Rate and Rhythm: Normal rate.  Pulmonary:     Effort: Pulmonary effort is normal.  Abdominal:     General: There is no distension.  Musculoskeletal:        General: Normal range of motion.     Cervical back: Normal range of motion.  Skin:    General: Skin is warm.  Neurological:     General: No focal deficit present.     Mental Status: She is alert and oriented to person, place, and time.  Psychiatric:        Mood and Affect:  Mood normal.     ED Results / Procedures / Treatments   Labs (all labs ordered are listed, but only abnormal results are displayed) Labs Reviewed  CBC WITH DIFFERENTIAL/PLATELET - Abnormal; Notable for the following components:      Result Value   Hemoglobin 11.8 (*)    HCT 35.6 (*)    All other components within normal limits  COMPREHENSIVE METABOLIC PANEL WITH GFR  URINALYSIS, ROUTINE W REFLEX MICROSCOPIC  PREGNANCY, URINE  CBG MONITORING, ED  TROPONIN T, HIGH SENSITIVITY  TROPONIN T, HIGH SENSITIVITY    EKG None  Radiology DG Hand Complete Left Result Date: 05/06/2024 CLINICAL DATA:  fall with left hand pain EXAM: LEFT HAND - COMPLETE 3+ VIEW COMPARISON:  None FINDINGS: No acute fracture or dislocation. There is no evidence of arthropathy or other focal bone abnormality. Soft tissues are unremarkable. No radiopaque foreign body. IMPRESSION: No acute fracture or dislocation. Electronically Signed   By: Rance Burrows M.D.   On: 05/06/2024 18:54   CT Head Wo Contrast Result Date: 05/06/2024 CLINICAL DATA:  Fall last night, facial injury EXAM: CT HEAD WITHOUT CONTRAST CT CERVICAL SPINE WITHOUT CONTRAST TECHNIQUE: Multidetector CT imaging of the head and cervical spine was performed following the standard protocol without intravenous contrast. Multiplanar CT image reconstructions of the cervical spine were also generated. RADIATION DOSE REDUCTION: This exam was performed according to the departmental dose-optimization program which includes automated exposure control, adjustment of the mA and/or kV according to patient size and/or use of iterative reconstruction technique. COMPARISON:  09/16/2022 FINDINGS: CT HEAD FINDINGS Brain: No evidence of acute infarction, hemorrhage, hydrocephalus, extra-axial collection or mass lesion/mass effect. Vascular: No hyperdense vessel or unexpected calcification. Skull: Normal. Negative for fracture or focal lesion. Sinuses/Orbits: No acute finding.  Other: None. CT CERVICAL SPINE FINDINGS Alignment: Degenerative and postoperative straightening of the normal cervical lordosis. Skull base and vertebrae: No acute fracture. No primary bone lesion or focal pathologic process. Soft tissues and spinal canal: No prevertebral fluid or swelling. No visible canal hematoma. Disc levels: Status post anterior cervical discectomy and fusion of C3-C4. Moderate disc degenerative change of C5-C7 with partial ankylosis of C5-C6. Upper chest: Negative. Other: None. IMPRESSION: 1. No acute intracranial pathology. 2. No fracture or static subluxation of the cervical spine. 3. Status post anterior cervical discectomy and fusion of C3-C4. Moderate disc degenerative change of C5-C7 with partial ankylosis of C5-C6. Electronically Signed   By: Fredricka Jenny M.D.   On: 05/06/2024 16:08   CT Cervical Spine Wo Contrast Result Date: 05/06/2024 CLINICAL DATA:  Fall last night,  facial injury EXAM: CT HEAD WITHOUT CONTRAST CT CERVICAL SPINE WITHOUT CONTRAST TECHNIQUE: Multidetector CT imaging of the head and cervical spine was performed following the standard protocol without intravenous contrast. Multiplanar CT image reconstructions of the cervical spine were also generated. RADIATION DOSE REDUCTION: This exam was performed according to the departmental dose-optimization program which includes automated exposure control, adjustment of the mA and/or kV according to patient size and/or use of iterative reconstruction technique. COMPARISON:  09/16/2022 FINDINGS: CT HEAD FINDINGS Brain: No evidence of acute infarction, hemorrhage, hydrocephalus, extra-axial collection or mass lesion/mass effect. Vascular: No hyperdense vessel or unexpected calcification. Skull: Normal. Negative for fracture or focal lesion. Sinuses/Orbits: No acute finding. Other: None. CT CERVICAL SPINE FINDINGS Alignment: Degenerative and postoperative straightening of the normal cervical lordosis. Skull base and vertebrae:  No acute fracture. No primary bone lesion or focal pathologic process. Soft tissues and spinal canal: No prevertebral fluid or swelling. No visible canal hematoma. Disc levels: Status post anterior cervical discectomy and fusion of C3-C4. Moderate disc degenerative change of C5-C7 with partial ankylosis of C5-C6. Upper chest: Negative. Other: None. IMPRESSION: 1. No acute intracranial pathology. 2. No fracture or static subluxation of the cervical spine. 3. Status post anterior cervical discectomy and fusion of C3-C4. Moderate disc degenerative change of C5-C7 with partial ankylosis of C5-C6. Electronically Signed   By: Fredricka Jenny M.D.   On: 05/06/2024 16:08    Procedures Procedures    Medications Ordered in ED Medications  oxyCODONE -acetaminophen  (PERCOCET/ROXICET) 5-325 MG per tablet 1 tablet (1 tablet Oral Given 05/06/24 1654)    ED Course/ Medical Decision Making/ A&P                                 Medical Decision Making Patient reports that she passed out last night patient states that she had had a shot of alcohol.  Patient reports a history of dizziness if she turns her head quickly or bends over.  Patient has had neck surgery.  Patient denies any fever or chills.  She has broken a tooth and has soreness in her nose and her hand  Amount and/or Complexity of Data Reviewed Independent Historian:     Details: Patient is here with family who is supportive Labs: ordered. Decision-making details documented in ED Course.    Details: Labs ordered reviewed and interpreted urinalysis is negative CBC is normal chemistries are normal troponin is negative Radiology: ordered and independent interpretation performed. Decision-making details documented in ED Course.    Details: CT head and CT cervical spine show no acute intercranial pathology no fracture or subluxation of cervical spine patient has had a C3-C4 discectomy and fusion. X-ray left hand no fractures ECG/medicine tests: ordered and  independent interpretation performed.    Details: EKG sinus bradycardia 59  Risk Prescription drug management. Risk Details: Patient looks well normal laboratory evaluation.  CT head and CT cervical spine do not show any acute injury x-ray hand no acute injury patient is advised to follow-up with dentist for evaluation of broken tooth she is given the phone number for Dr. Annmarie Kindred dentist on call.  Patient is given a prescription for hydrocodone  for pain.  She is advised to return to the emergency department for any problems she is advised to follow-up with her primary care physician for recheck.  She is encouraged to drink plenty of water avoid alcohol and move slowly.  Final Clinical Impression(s) / ED Diagnoses Final diagnoses:  Syncope and collapse  Dental injury, initial encounter  Contusion of left hand, initial encounter    Rx / DC Orders ED Discharge Orders          Ordered    HYDROcodone -acetaminophen  (NORCO/VICODIN) 5-325 MG tablet  Every 6 hours PRN        05/06/24 1940           An After Visit Summary was printed and given to the patient.    Sandi Crosby, PA-C 05/07/24 0007    Nicklas Barns, MD 05/07/24 [phone]

## 2024-05-06 NOTE — ED Triage Notes (Addendum)
 BIB family from home, here for LOC/ syncope, onset last night. [facility] face first, c/o face, lip, L arm throbbing, L shoulder pain, L hand/ index finger pain. Denies recent illness, NVD, fever or bleeding. H/o neck surgery. Alert, NAD, calm, interactive. Mentions loose teeth, pinpoints L maxillary lateral incisor, #10. No bleeding or deformity noted.

## 2024-07-04 ENCOUNTER — Ambulatory Visit: Admitting: Family Medicine

## 2024-09-29 ENCOUNTER — Encounter: Payer: Self-pay | Admitting: Family Medicine

## 2024-09-29 ENCOUNTER — Ambulatory Visit: Admitting: Family Medicine

## 2024-09-29 VITALS — BP 130/70 | HR 65 | Temp 98.6°F | Ht 64.0 in | Wt 224.4 lb

## 2024-09-29 DIAGNOSIS — G959 Disease of spinal cord, unspecified: Secondary | ICD-10-CM

## 2024-09-29 DIAGNOSIS — Z23 Encounter for immunization: Secondary | ICD-10-CM

## 2024-09-29 DIAGNOSIS — G629 Polyneuropathy, unspecified: Secondary | ICD-10-CM | POA: Diagnosis not present

## 2024-09-29 DIAGNOSIS — G43909 Migraine, unspecified, not intractable, without status migrainosus: Secondary | ICD-10-CM

## 2024-09-29 DIAGNOSIS — G894 Chronic pain syndrome: Secondary | ICD-10-CM | POA: Diagnosis not present

## 2024-09-29 DIAGNOSIS — E66812 Obesity, class 2: Secondary | ICD-10-CM

## 2024-09-29 DIAGNOSIS — M5412 Radiculopathy, cervical region: Secondary | ICD-10-CM

## 2024-09-29 DIAGNOSIS — Z7689 Persons encountering health services in other specified circumstances: Secondary | ICD-10-CM

## 2024-09-29 DIAGNOSIS — Z6838 Body mass index (BMI) 38.0-38.9, adult: Secondary | ICD-10-CM

## 2024-09-29 MED ORDER — HYDROCODONE-ACETAMINOPHEN 5-325 MG PO TABS: 1.0000 | ORAL_TABLET | Freq: Four times a day (QID) | ORAL | 0 refills | Status: AC | PRN

## 2024-09-29 MED ORDER — GABAPENTIN 100 MG PO CAPS: 100.0000 mg | ORAL_CAPSULE | Freq: Three times a day (TID) | ORAL | 3 refills | Status: AC

## 2024-09-29 MED ORDER — TIZANIDINE HCL 4 MG PO TABS: 4.0000 mg | ORAL_TABLET | Freq: Four times a day (QID) | ORAL | 0 refills | Status: DC | PRN

## 2024-09-29 NOTE — Patient Instructions (Addendum)
 Welcome to [facility]!  Thank you for choosing us  for your Primary Care needs.   We offer in person and video appointments for your convenience. You may call our office to schedule appointments, or you may schedule appointments with me through MyChart.   The best way to get in contact with me is via MyChart message. This will get to me faster than a phone call, unless there is an emergency, then please call 911.  The lab is located downstairs in the Sports Medicine building, we also have xray available there.   Gabapentin 100mg  three times per day as needed for nerve pain.   Tizanidine 4mg  twice a day as needed for muscle spasms. This medication can make you sleepy, so do not drive until you know how it affects you.   Referral back to neurosurgery today.  Hydrocodone  5-325mg  one tablet ever 6 hours as needed for breakthrough pain.  Follow-up with me for new or worsening symptoms.

## 2024-09-29 NOTE — Progress Notes (Signed)
 New Patient Visit  Subjective:     Patient ID: Nicole House, female    DOB: [birth year], 48 y.o.   MRN: [id number]  Chief Complaint  Patient presents with   Establish Care    Cervical neck surgery 10/23 experiencing constant pain in neck and back, tingling/pain in hands and feet, difficulty gripping anything    HPI  Discussed the use of AI scribe software for clinical note transcription with the patient, who gave verbal consent to proceed.  History of Present Illness Nicole House is a 48 year old female with a history of neck surgery and neuropathy who presents with neuropathic pain and tingling in her extremities.  Neuropathic pain and paresthesia - Persistent tingling in feet - Neuropathy in arms, legs, and neck following anterior cervical discectomy and fusion (ACDF) in 2023 - Numbness and tingling in hands, described as resembling frostbite, with one hand feeling hot and the other cold - Pain radiates from neck down shoulder and arm, causing heaviness and difficulty lifting - Right side from waist down feels numb, wet, and weighted - Difficulty gripping objects, leading to frequent dropping, particularly affecting fingertips  Cervical stiffness and mechanical symptoms - Neck stiffness and frequent cracking - Pain radiates from neck down to shoulder and arm  Gait disturbance and balance impairment - Uses a cane for balance - Feels off-balance rather than dizzy  Migraine headaches - Severe migraines - Tramadol and Toradol  have not provided relief  Medication intolerance and pain management challenges - Not taking pain medication recently due to previous stomach issues with high doses of ibuprofen - Concern about interactions with oxycodone      ROS Per HPI  Outpatient Encounter Medications as of 09/29/2024  Medication Sig   gabapentin (NEURONTIN) 100 MG capsule Take 1 capsule (100 mg total) by mouth 3 (three) times daily.   HYDROcodone -acetaminophen   (NORCO/VICODIN) 5-325 MG tablet Take 1 tablet by mouth every 6 (six) hours as needed for up to 7 days for severe pain (pain score 7-10) (breakthrough pain).   tiZANidine (ZANAFLEX) 4 MG tablet Take 1 tablet (4 mg total) by mouth every 6 (six) hours as needed for muscle spasms.   [DISCONTINUED] APPLE CIDER VINEGAR PO Take 2 tablets by mouth daily. (Patient not taking: Reported on 09/29/2024)   [DISCONTINUED] baclofen  (LIORESAL ) 10 MG tablet Take 1 tablet (10 mg total) by mouth 2 (two) times daily as needed for muscle spasms. (Patient not taking: Reported on 09/29/2024)   [DISCONTINUED] methylPREDNISolone  (MEDROL  DOSEPAK) 4 MG TBPK tablet Follow package directions (Patient not taking: Reported on 09/29/2024)   [DISCONTINUED] omeprazole  (PRILOSEC) 20 MG capsule Take 1 capsule (20 mg total) by mouth daily. (Patient not taking: Reported on 09/29/2024)   [DISCONTINUED] oxyCODONE -acetaminophen  (PERCOCET) 5-325 MG tablet Take 1 tablet by mouth every 4 (four) hours as needed. (Patient not taking: Reported on 09/29/2024)   [DISCONTINUED] predniSONE  (STERAPRED UNI-PAK 21 TAB) 10 MG (21) TBPK tablet As directed (Patient not taking: Reported on 09/29/2024)   [DISCONTINUED] triamcinolone  cream (KENALOG ) 0.1 % Apply 1 Application topically 2 (two) times daily. (Patient not taking: Reported on 09/29/2024)   No facility-administered encounter medications on file as of 09/29/2024.    Past Medical History:  Diagnosis Date   Complication of anesthesia    slow to wake after tubal ligation   GERD (gastroesophageal reflux disease)     Past Surgical History:  Procedure Laterality Date   ANTERIOR CERVICAL DECOMP/DISCECTOMY FUSION N/A 10/01/2022   Procedure: Anterior Cervical Discectomy Fusion Cervical  Three-Four;  Surgeon: Debby Dorn MATSU, MD;  Location: [facility] OR;  Service: Neurosurgery;  Laterality: N/A;  3C   TUBAL LIGATION      Family History  Problem Relation Age of Onset   Cancer Maternal Grandmother         ?type   Diabetes Maternal Grandmother    Heart failure Maternal Grandmother    Cancer Maternal Grandfather        ?type    Social History   Socioeconomic History   Marital status: Married    Spouse name: Zeb   Number of children: 2   Years of education: Not on file   Highest education level: Not on file  Occupational History   Not on file  Tobacco Use   Smoking status: Former    Types: Cigarettes    Start date: 03/26/2013   Smokeless tobacco: Never  Vaping Use   Vaping status: Some Days  Substance and Sexual Activity   Alcohol use: Yes    Comment: occasionally   Drug use: No   Sexual activity: Not on file  Other Topics Concern   Not on file  Social History Narrative   Not on file   Social Drivers of Health   Financial Resource Strain: Not on file  Food Insecurity: Not on file  Transportation Needs: Not on file  Physical Activity: Not on file  Stress: Not on file  Social Connections: Not on file  Intimate Partner Violence: Not on file       Objective:    BP 130/70 (BP Location: Left Arm, Patient Position: Sitting)   Pulse 65   Temp 98.6 F (37 C) (Oral)   Ht 5' 4 (1.626 m)   Wt 224 lb 6.4 oz (101.8 kg)   SpO2 98%   BMI 38.52 kg/m    Physical Exam Vitals and nursing note reviewed.  Constitutional:      General: She is not in acute distress.    Appearance: Normal appearance.  HENT:     Head: Normocephalic and atraumatic.     Right Ear: External ear normal.     Left Ear: External ear normal.     Nose: Nose normal.     Mouth/Throat:     Mouth: Mucous membranes are moist.     Pharynx: Oropharynx is clear.  Eyes:     Extraocular Movements: Extraocular movements intact.     Pupils: Pupils are equal, round, and reactive to light.  Neck:     Comments: LROM to neck, bilateral posterior cervico spinal muscles tender, in spasm  Cardiovascular:     Rate and Rhythm: Normal rate and regular rhythm.     Pulses: Normal pulses.     Heart sounds: Normal  heart sounds.  Pulmonary:     Effort: Pulmonary effort is normal. No respiratory distress.     Breath sounds: Normal breath sounds. No wheezing, rhonchi or rales.  Musculoskeletal:     Right lower leg: No edema.     Left lower leg: No edema.  Lymphadenopathy:     Cervical: No cervical adenopathy.  Neurological:     General: No focal deficit present.     Mental Status: She is alert and oriented to person, place, and time.  Psychiatric:        Mood and Affect: Mood normal.        Thought Content: Thought content normal.     No results found for any visits on 09/29/24.      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 Differential includes neck injury neuropathy and possible lumbar issues. Symptoms worsen with prolonged sitting and certain movements. Gabapentin and muscle relaxers considered for management. - Order MRI of the neck. - Refer to neurosurgery for evaluation. - Prescribe gabapentin 100 mg, titrate as needed. - Prescribe tizanidine, advise caution with gabapentin. - Discuss hydrocodone  for breakthrough pain.  Chronic pain syndrome Chronic pain syndrome affects daily activities and quality of life. Pain management complicated by previous medication use and side effects. Current strategy includes gabapentin and muscle relaxers, with potential hydrocodone  for breakthrough pain. - Prescribe gabapentin. - Prescribe tizanidine. - Consider hydrocodone  for breakthrough pain.  Migraine w/o status migrainosus Severe migraines previously treated with tramadol and Toradol . Past adverse reaction to migraine medication noted.  Class 2 obesity, BMI 38 with serious comorbidity (Chronic Pain) Obesity with weight management difficulty due to chronic pain and limited mobility. Concern about  weight gain and health impact. - Discuss weight management strategies considering physical limitations.  Immunization Due Flu vaccine today      Orders Placed This Encounter  Procedures   Flu vaccine trivalent PF, 6mos and older(Flulaval,Afluria,Fluarix,Fluzone)     Meds ordered this encounter  Medications   gabapentin (NEURONTIN) 100 MG capsule    Sig: Take 1 capsule (100 mg total) by mouth 3 (three) times daily.    Dispense:  90 capsule    Refill:  3   tiZANidine (ZANAFLEX) 4 MG tablet    Sig: Take 1 tablet (4 mg total) by mouth every 6 (six) hours as needed for muscle spasms.    Dispense:  30 tablet    Refill:  0   HYDROcodone -acetaminophen  (NORCO/VICODIN) 5-325 MG tablet    Sig: Take 1 tablet by mouth every 6 (six) hours as needed for up to 7 days for severe pain (pain score 7-10) (breakthrough pain).    Dispense:  21 tablet    Refill:  0    Return if symptoms worsen or fail to improve.  Corean LITTIE Ku, FNP

## 2024-10-04 DIAGNOSIS — G894 Chronic pain syndrome: Secondary | ICD-10-CM | POA: Insufficient documentation

## 2024-11-06 ENCOUNTER — Other Ambulatory Visit: Payer: Self-pay | Admitting: Family Medicine

## 2024-11-06 DIAGNOSIS — G629 Polyneuropathy, unspecified: Secondary | ICD-10-CM

## 2024-11-06 DIAGNOSIS — G959 Disease of spinal cord, unspecified: Secondary | ICD-10-CM
